# Patient Record
Sex: Female | Born: 1940 | Race: Black or African American | Hispanic: No | Smoking: Former smoker
Health system: Southern US, Community
[De-identification: ages and names within clinical notes are randomized; demographics above are authoritative.]

## PROBLEM LIST (undated history)

## (undated) DIAGNOSIS — N3289 Other specified disorders of bladder: Secondary | ICD-10-CM

## (undated) DIAGNOSIS — I10 Essential (primary) hypertension: Secondary | ICD-10-CM

## (undated) DIAGNOSIS — E669 Obesity, unspecified: Secondary | ICD-10-CM

## (undated) DIAGNOSIS — K760 Fatty (change of) liver, not elsewhere classified: Secondary | ICD-10-CM

## (undated) DIAGNOSIS — R109 Unspecified abdominal pain: Secondary | ICD-10-CM

## (undated) DIAGNOSIS — K5900 Constipation, unspecified: Secondary | ICD-10-CM

## (undated) DIAGNOSIS — D3A8 Other benign neuroendocrine tumors: Secondary | ICD-10-CM

## (undated) DIAGNOSIS — E785 Hyperlipidemia, unspecified: Secondary | ICD-10-CM

## (undated) DIAGNOSIS — K219 Gastro-esophageal reflux disease without esophagitis: Secondary | ICD-10-CM

## (undated) DIAGNOSIS — I4891 Unspecified atrial fibrillation: Secondary | ICD-10-CM

## (undated) DIAGNOSIS — K5790 Diverticulosis of intestine, part unspecified, without perforation or abscess without bleeding: Secondary | ICD-10-CM

## (undated) DIAGNOSIS — M1990 Unspecified osteoarthritis, unspecified site: Secondary | ICD-10-CM

## (undated) DIAGNOSIS — Z8489 Family history of other specified conditions: Secondary | ICD-10-CM

## (undated) HISTORY — PX: ROTATOR CUFF REPAIR: SHX139

## (undated) HISTORY — PX: EYE SURGERY: SHX253

## (undated) HISTORY — PX: CHOLECYSTECTOMY: SHX55

## (undated) HISTORY — PX: ABDOMINAL HYSTERECTOMY: SHX81

---

## 2005-02-07 ENCOUNTER — Ambulatory Visit: Payer: Self-pay | Admitting: Internal Medicine

## 2006-02-12 ENCOUNTER — Ambulatory Visit: Payer: Self-pay | Admitting: Internal Medicine

## 2006-03-14 ENCOUNTER — Emergency Department: Payer: Self-pay | Admitting: Unknown Physician Specialty

## 2007-06-21 ENCOUNTER — Ambulatory Visit: Payer: Self-pay | Admitting: Internal Medicine

## 2008-06-25 ENCOUNTER — Ambulatory Visit: Payer: Self-pay | Admitting: Internal Medicine

## 2008-07-29 ENCOUNTER — Ambulatory Visit: Payer: Self-pay | Admitting: Gastroenterology

## 2009-08-11 ENCOUNTER — Ambulatory Visit: Payer: Self-pay | Admitting: Internal Medicine

## 2009-11-18 ENCOUNTER — Ambulatory Visit: Payer: Self-pay | Admitting: Cardiology

## 2010-05-05 ENCOUNTER — Emergency Department: Payer: Self-pay | Admitting: Unknown Physician Specialty

## 2010-08-31 ENCOUNTER — Ambulatory Visit: Payer: Self-pay | Admitting: Internal Medicine

## 2011-02-26 ENCOUNTER — Emergency Department: Payer: Self-pay | Admitting: Emergency Medicine

## 2011-04-19 ENCOUNTER — Ambulatory Visit: Payer: Self-pay | Admitting: Internal Medicine

## 2011-05-08 ENCOUNTER — Emergency Department: Payer: Self-pay | Admitting: Emergency Medicine

## 2011-05-10 ENCOUNTER — Ambulatory Visit: Payer: Self-pay | Admitting: Family Medicine

## 2011-06-02 ENCOUNTER — Ambulatory Visit: Payer: Self-pay | Admitting: Surgery

## 2011-06-08 ENCOUNTER — Inpatient Hospital Stay: Payer: Self-pay | Admitting: Surgery

## 2011-06-12 LAB — PATHOLOGY REPORT

## 2011-07-25 DIAGNOSIS — R109 Unspecified abdominal pain: Secondary | ICD-10-CM

## 2011-07-25 HISTORY — DX: Unspecified abdominal pain: R10.9

## 2011-07-25 HISTORY — PX: HERNIA REPAIR: SHX51

## 2011-10-18 ENCOUNTER — Ambulatory Visit: Payer: Self-pay | Admitting: Internal Medicine

## 2012-03-04 ENCOUNTER — Ambulatory Visit: Payer: Self-pay

## 2012-08-23 ENCOUNTER — Ambulatory Visit: Payer: Self-pay

## 2012-09-15 DIAGNOSIS — K439 Ventral hernia without obstruction or gangrene: Secondary | ICD-10-CM | POA: Insufficient documentation

## 2012-09-26 DIAGNOSIS — K219 Gastro-esophageal reflux disease without esophagitis: Secondary | ICD-10-CM | POA: Insufficient documentation

## 2012-09-27 HISTORY — PX: HERNIA REPAIR: SHX51

## 2012-12-24 ENCOUNTER — Ambulatory Visit: Payer: Self-pay

## 2013-03-25 ENCOUNTER — Ambulatory Visit: Payer: Self-pay | Admitting: Physician Assistant

## 2014-01-14 ENCOUNTER — Ambulatory Visit: Payer: Self-pay

## 2014-07-02 DIAGNOSIS — S92501A Displaced unspecified fracture of right lesser toe(s), initial encounter for closed fracture: Secondary | ICD-10-CM | POA: Insufficient documentation

## 2014-12-16 ENCOUNTER — Other Ambulatory Visit: Payer: Self-pay | Admitting: Infectious Diseases

## 2014-12-16 DIAGNOSIS — K5909 Other constipation: Secondary | ICD-10-CM

## 2014-12-16 DIAGNOSIS — R1033 Periumbilical pain: Secondary | ICD-10-CM

## 2014-12-23 ENCOUNTER — Ambulatory Visit
Admission: RE | Admit: 2014-12-23 | Discharge: 2014-12-23 | Disposition: A | Discharge status: Home / Self Care | Payer: Medicare Other | Source: Ambulatory Visit | Attending: Infectious Diseases | Admitting: Infectious Diseases

## 2014-12-23 DIAGNOSIS — Z9049 Acquired absence of other specified parts of digestive tract: Secondary | ICD-10-CM | POA: Diagnosis not present

## 2014-12-23 DIAGNOSIS — K5909 Other constipation: Secondary | ICD-10-CM

## 2014-12-23 DIAGNOSIS — R1033 Periumbilical pain: Secondary | ICD-10-CM | POA: Diagnosis present

## 2014-12-23 DIAGNOSIS — K5900 Constipation, unspecified: Secondary | ICD-10-CM | POA: Insufficient documentation

## 2014-12-23 DIAGNOSIS — K760 Fatty (change of) liver, not elsewhere classified: Secondary | ICD-10-CM | POA: Diagnosis not present

## 2015-05-06 ENCOUNTER — Other Ambulatory Visit: Payer: Self-pay | Admitting: Infectious Diseases

## 2015-05-06 DIAGNOSIS — Z1231 Encounter for screening mammogram for malignant neoplasm of breast: Secondary | ICD-10-CM

## 2015-05-18 ENCOUNTER — Ambulatory Visit
Admission: RE | Admit: 2015-05-18 | Discharge: 2015-05-18 | Disposition: A | Discharge status: Home / Self Care | Payer: Medicare Other | Source: Ambulatory Visit | Attending: Infectious Diseases | Admitting: Infectious Diseases

## 2015-05-18 ENCOUNTER — Other Ambulatory Visit: Payer: Self-pay | Admitting: Infectious Diseases

## 2015-05-18 DIAGNOSIS — Z1231 Encounter for screening mammogram for malignant neoplasm of breast: Secondary | ICD-10-CM | POA: Insufficient documentation

## 2015-06-25 DIAGNOSIS — E669 Obesity, unspecified: Secondary | ICD-10-CM | POA: Insufficient documentation

## 2015-06-25 DIAGNOSIS — K760 Fatty (change of) liver, not elsewhere classified: Secondary | ICD-10-CM | POA: Insufficient documentation

## 2016-02-01 ENCOUNTER — Other Ambulatory Visit: Payer: Self-pay | Admitting: Physician Assistant

## 2016-02-01 DIAGNOSIS — Z1239 Encounter for other screening for malignant neoplasm of breast: Secondary | ICD-10-CM

## 2016-02-07 ENCOUNTER — Encounter: Payer: Self-pay | Admitting: *Deleted

## 2016-02-08 ENCOUNTER — Ambulatory Visit
Admission: RE | Admit: 2016-02-08 | Discharge: 2016-02-08 | Disposition: A | Discharge status: Home / Self Care | Payer: Medicare Other | Source: Ambulatory Visit | Attending: Gastroenterology | Admitting: Gastroenterology

## 2016-02-08 ENCOUNTER — Ambulatory Visit: Payer: Medicare Other | Admitting: Anesthesiology

## 2016-02-08 ENCOUNTER — Encounter
Admission: RE | Disposition: A | Discharge status: Home / Self Care | Payer: Self-pay | Source: Ambulatory Visit | Attending: Gastroenterology

## 2016-02-08 DIAGNOSIS — K6289 Other specified diseases of anus and rectum: Secondary | ICD-10-CM | POA: Insufficient documentation

## 2016-02-08 DIAGNOSIS — K219 Gastro-esophageal reflux disease without esophagitis: Secondary | ICD-10-CM | POA: Insufficient documentation

## 2016-02-08 DIAGNOSIS — Z87891 Personal history of nicotine dependence: Secondary | ICD-10-CM | POA: Insufficient documentation

## 2016-02-08 DIAGNOSIS — Z885 Allergy status to narcotic agent status: Secondary | ICD-10-CM | POA: Diagnosis not present

## 2016-02-08 DIAGNOSIS — Z1211 Encounter for screening for malignant neoplasm of colon: Secondary | ICD-10-CM | POA: Diagnosis not present

## 2016-02-08 DIAGNOSIS — Z6830 Body mass index (BMI) 30.0-30.9, adult: Secondary | ICD-10-CM | POA: Insufficient documentation

## 2016-02-08 DIAGNOSIS — I10 Essential (primary) hypertension: Secondary | ICD-10-CM | POA: Diagnosis not present

## 2016-02-08 DIAGNOSIS — Z79899 Other long term (current) drug therapy: Secondary | ICD-10-CM | POA: Diagnosis not present

## 2016-02-08 DIAGNOSIS — K5730 Diverticulosis of large intestine without perforation or abscess without bleeding: Secondary | ICD-10-CM | POA: Insufficient documentation

## 2016-02-08 DIAGNOSIS — M1990 Unspecified osteoarthritis, unspecified site: Secondary | ICD-10-CM | POA: Diagnosis not present

## 2016-02-08 DIAGNOSIS — D3A026 Benign carcinoid tumor of the rectum: Secondary | ICD-10-CM | POA: Diagnosis not present

## 2016-02-08 DIAGNOSIS — E785 Hyperlipidemia, unspecified: Secondary | ICD-10-CM | POA: Diagnosis not present

## 2016-02-08 DIAGNOSIS — E669 Obesity, unspecified: Secondary | ICD-10-CM | POA: Insufficient documentation

## 2016-02-08 DIAGNOSIS — D3A8 Other benign neuroendocrine tumors: Secondary | ICD-10-CM | POA: Insufficient documentation

## 2016-02-08 HISTORY — PX: COLONOSCOPY WITH PROPOFOL: SHX5780

## 2016-02-08 HISTORY — DX: Family history of other specified conditions: Z84.89

## 2016-02-08 HISTORY — DX: Gastro-esophageal reflux disease without esophagitis: K21.9

## 2016-02-08 HISTORY — DX: Essential (primary) hypertension: I10

## 2016-02-08 HISTORY — DX: Unspecified osteoarthritis, unspecified site: M19.90

## 2016-02-08 HISTORY — DX: Hyperlipidemia, unspecified: E78.5

## 2016-02-08 HISTORY — DX: Unspecified abdominal pain: R10.9

## 2016-02-08 HISTORY — DX: Constipation, unspecified: K59.00

## 2016-02-08 HISTORY — DX: Obesity, unspecified: E66.9

## 2016-02-08 HISTORY — DX: Other specified disorders of bladder: N32.89

## 2016-02-08 SURGERY — COLONOSCOPY WITH PROPOFOL
Anesthesia: General

## 2016-02-08 MED ORDER — PROPOFOL 10 MG/ML IV BOLUS
INTRAVENOUS | Status: DC | PRN
  Administered 2016-02-08: 100 mg via INTRAVENOUS

## 2016-02-08 MED ORDER — FENTANYL CITRATE (PF) 100 MCG/2ML IJ SOLN
INTRAMUSCULAR | Status: DC | PRN
  Administered 2016-02-08: 50 ug via INTRAVENOUS

## 2016-02-08 MED ORDER — PROPOFOL 500 MG/50ML IV EMUL
INTRAVENOUS | Status: DC | PRN
  Administered 2016-02-08: 150 ug/kg/min via INTRAVENOUS

## 2016-02-08 MED ORDER — LIDOCAINE 2% (20 MG/ML) 5 ML SYRINGE
INTRAMUSCULAR | Status: DC | PRN
  Administered 2016-02-08: 40 mg via INTRAVENOUS

## 2016-02-08 MED ORDER — SODIUM CHLORIDE 0.9 % IV SOLN
INTRAVENOUS | Status: DC
Start: 2016-02-08 — End: 2016-02-08
  Administered 2016-02-08: 1000 mL via INTRAVENOUS
  Administered 2016-02-08: 08:00:00 via INTRAVENOUS

## 2016-02-08 MED ORDER — MIDAZOLAM HCL 5 MG/5ML IJ SOLN
INTRAMUSCULAR | Status: DC | PRN
  Administered 2016-02-08: 1 mg via INTRAVENOUS

## 2016-02-08 MED ORDER — PHENYLEPHRINE HCL 10 MG/ML IJ SOLN
INTRAMUSCULAR | Status: DC | PRN
  Administered 2016-02-08 (×2): 100 ug via INTRAVENOUS

## 2016-02-08 MED ORDER — SODIUM CHLORIDE 0.9 % IV SOLN: INTRAVENOUS | Status: DC

## 2016-02-08 NOTE — Anesthesia Preprocedure Evaluation (Signed)
Anesthesia Evaluation  Patient identified by MRN, date of birth, ID band Patient awake    Reviewed: Allergy & Precautions, H&P , NPO status , Patient's Chart, lab work & pertinent test results, reviewed documented beta blocker date and time   Airway Mallampati: II   Neck ROM: full    Dental  (+) Teeth Intact   Pulmonary neg pulmonary ROS, former smoker,    Pulmonary exam normal        Cardiovascular Exercise Tolerance: Good hypertension, On Medications negative cardio ROS Normal cardiovascular exam Rhythm:regular Rate:Normal     Neuro/Psych negative neurological ROS  negative psych ROS   GI/Hepatic negative GI ROS, Neg liver ROS, GERD  Medicated,  Endo/Other  negative endocrine ROS  Renal/GU negative Renal ROS  negative genitourinary   Musculoskeletal   Abdominal   Peds  Hematology negative hematology ROS (+)   Anesthesia Other Findings Past Medical History:   Abdominal pain                                  2013         Bladder wall thickening                                      Constipation                                                 Hyperlipidemia                                               Hypertension                                                 Arthritis                                                    Obesity (BMI 30-39.9)                                        GERD (gastroesophageal reflux disease)                       Family history of adverse reaction to anesthes*            Past Surgical History:   EYE SURGERY                                                   ABDOMINAL HYSTERECTOMY  CHOLECYSTECTOMY                                               ROTATOR CUFF REPAIR                             Left              HERNIA REPAIR                                    2013           Comment:umbilical   HERNIA REPAIR                                    09/27/2012        Comment:ventral hernia BMI    Body Mass Index   32.54 kg/m 2     Reproductive/Obstetrics negative OB ROS                             Anesthesia Physical Anesthesia Plan  ASA: III  Anesthesia Plan: General   Post-op Pain Management:    Induction:   Airway Management Planned:   Additional Equipment:   Intra-op Plan:   Post-operative Plan:   Informed Consent: I have reviewed the patients History and Physical, chart, labs and discussed the procedure including the risks, benefits and alternatives for the proposed anesthesia with the patient or authorized representative who has indicated his/her understanding and acceptance.   Dental Advisory Given  Plan Discussed with: CRNA  Anesthesia Plan Comments:         Anesthesia Quick Evaluation

## 2016-02-08 NOTE — Op Note (Addendum)
[facility] Patient Name: Jodi Stein Procedure Date: 02/08/2016 8:30 AM MRN: [id number] Account #: [id number] Date of Birth: [birth year] Admit Type: Outpatient Age: 75 Room: [facility] ENDO ROOM 3 Gender: Female Note Status: Supervisor Override Procedure:            Colonoscopy Indications:          Screening for colorectal malignant neoplasm Providers:            Lollie Sails, MD Referring MD:         Adrian Prows (Referring MD) Medicines:            Monitored Anesthesia Care Complications:        No immediate complications. Procedure:            Pre-Anesthesia Assessment:                       - ASA Grade Assessment: III - A patient with severe                        systemic disease.                       After obtaining informed consent, the colonoscope was                        passed under direct vision. Throughout the procedure,                        the patient's blood pressure, pulse, and oxygen                        saturations were monitored continuously. The                        Colonoscope was introduced through the anus and                        advanced to the the cecum, identified by appendiceal                        orifice and ileocecal valve. The colonoscopy was                        unusually difficult due to significant looping and a                        tortuous colon. Successful completion of the procedure                        was aided by changing the patient to a prone position                        and using manual pressure. The patient tolerated the                        procedure well. The quality of the bowel preparation                        was good. Findings:      Multiple medium-mouthed diverticula were found in the sigmoid colon,  descending colon, transverse colon and ascending colon.      One 2 mm submucosal nodule was found in the distal rectum. Biopsies were       taken with a cold  forceps for histology.      The digital rectal exam was normal.      The descending colon and transverse colon were significantly redundant. Impression:           - Diverticulosis in the sigmoid colon, in the                        descending colon, in the transverse colon and in the                        ascending colon.                       - Submucosal nodule in the distal rectum. Biopsied. Recommendation:       - Discharge patient to home. Procedure Code(s):    --- Professional ---                       [phone], Colonoscopy, flexible; with biopsy, single or                        multiple Diagnosis Code(s):    --- Professional ---                       Z12.11, Encounter for screening for malignant neoplasm                        of colon                       K62.89, Other specified diseases of anus and rectum                       K57.30, Diverticulosis of large intestine without                        perforation or abscess without bleeding CPT copyright 2016 American Medical Association. All rights reserved. The codes documented in this report are preliminary and upon coder review may  be revised to meet current compliance requirements. Lollie Sails, MD 02/08/2016 9:14:19 AM This report has been signed electronically. Number of Addenda: 0 Note Initiated On: 02/08/2016 8:30 AM Scope Withdrawal Time: 0 hours 9 minutes 46 seconds  Total Procedure Duration: 0 hours 31 minutes 51 seconds       [facility]

## 2016-02-08 NOTE — H&P (Signed)
Outpatient short stay form Pre-procedure 02/08/2016 8:24 AM Lollie Sails MD  Primary Physician: Dr. Adrian Prows  Reason for visit:  Colonoscopy  History of present illness:  Patient is a 75 year old female presenting today for screening colonoscopy. Her last colonoscopy was about 10 years ago. She does not remember whether there were any polyps at that time. There is no family history of colon cancer. Tolerated her prep well. She takes no aspirin or blood thinning agents.    Current facility-administered medications:  .  0.9 %  sodium chloride infusion, , Intravenous, Continuous, Lollie Sails, MD, Last Rate: 20 mL/hr at 02/08/16 0721 .  0.9 %  sodium chloride infusion, , Intravenous, Continuous, Lollie Sails, MD  Prescriptions prior to admission  Medication Sig Dispense Refill Last Dose  . fluticasone (FLONASE) 50 MCG/ACT nasal spray Place 1 spray into both nostrils 2 (two) times daily.     Marland Kitchen ibuprofen (ADVIL,MOTRIN) 100 MG tablet Take 800 mg by mouth every 6 (six) hours as needed for fever.     . lactulose (CHRONULAC) 10 GM/15ML solution Take 10 g by mouth once a week.     Marland Kitchen lisinopril (PRINIVIL,ZESTRIL) 20 MG tablet Take 20 mg by mouth daily.   02/08/2016 at 0600  . naproxen sodium (ANAPROX) 220 MG tablet Take 220 mg by mouth 2 (two) times daily with a meal.     . omeprazole (PRILOSEC) 20 MG capsule Take 20 mg by mouth daily.     . simvastatin (ZOCOR) 40 MG tablet Take 40 mg by mouth daily.        Allergies  Allergen Reactions  . Codeine Nausea Only  . Tramadol Nausea And Vomiting     Past Medical History  Diagnosis Date  . Abdominal pain 2013  . Bladder wall thickening   . Constipation   . Hyperlipidemia   . Hypertension   . Arthritis   . Obesity (BMI 30-39.9)   . GERD (gastroesophageal reflux disease)   . Family history of adverse reaction to anesthesia     Review of systems:      Physical Exam    Heart and lungs: Regular rate and rhythm  without rub or gallop, lungs are bilaterally clear.    HEENT: Normocephalic atraumatic eyes are anicteric    Other:     Pertinant exam for procedure: Soft nontender nondistended bowel sounds positive normoactive, protuberant.    Planned proceedures: Colonoscopy and indicated procedures. I have discussed the risks benefits and complications of procedures to include not limited to bleeding, infection, perforation and the risk of sedation and the patient wishes to proceed.    Lollie Sails, MD Gastroenterology 02/08/2016  8:24 AM

## 2016-02-08 NOTE — Transfer of Care (Signed)
Immediate Anesthesia Transfer of Care Note  Patient: Jodi Stein  Procedure(s) Performed: Procedure(s): COLONOSCOPY WITH PROPOFOL (N/A)  Patient Location: PACU and Endoscopy Unit  Anesthesia Type:General  Level of Consciousness: oriented  Airway & Oxygen Therapy: Patient Spontanous Breathing and Patient connected to nasal cannula oxygen  Post-op Assessment: Report given to RN and Post -op Vital signs reviewed and stable  Post vital signs: stable  Last Vitals:  Filed Vitals:   02/08/16 0707 02/08/16 0917  BP: 120/53 104/73  Pulse: 83 70  Temp: 36.4 C 36.6 C  Resp: 18 17    Last Pain: There were no vitals filed for this visit.       Complications: No apparent anesthesia complications

## 2016-02-09 ENCOUNTER — Encounter: Payer: Self-pay | Admitting: Gastroenterology

## 2016-02-09 NOTE — Anesthesia Postprocedure Evaluation (Signed)
Anesthesia Post Note  Patient: Jodi Stein  Procedure(s) Performed: Procedure(s) (LRB): COLONOSCOPY WITH PROPOFOL (N/A)  Patient location during evaluation: PACU Anesthesia Type: General Level of consciousness: awake and alert Pain management: pain level controlled Vital Signs Assessment: post-procedure vital signs reviewed and stable Respiratory status: spontaneous breathing, nonlabored ventilation, respiratory function stable and patient connected to nasal cannula oxygen Cardiovascular status: blood pressure returned to baseline and stable Postop Assessment: no signs of nausea or vomiting Anesthetic complications: no    Last Vitals:  Filed Vitals:   02/08/16 0917 02/08/16 0924  BP: 104/73 104/73  Pulse: 70 71  Temp: 36.6 C 36.1 C  Resp: 17 15    Last Pain:  Filed Vitals:   02/09/16 0738  PainSc: 0-No pain                 Molli Barrows

## 2016-02-15 LAB — SURGICAL PATHOLOGY

## 2016-03-01 ENCOUNTER — Other Ambulatory Visit: Payer: Self-pay | Admitting: Infectious Diseases

## 2016-03-01 DIAGNOSIS — Z1231 Encounter for screening mammogram for malignant neoplasm of breast: Secondary | ICD-10-CM

## 2016-03-03 ENCOUNTER — Other Ambulatory Visit: Payer: Self-pay | Admitting: Gastroenterology

## 2016-03-03 DIAGNOSIS — D3A026 Benign carcinoid tumor of the rectum: Secondary | ICD-10-CM

## 2016-03-10 ENCOUNTER — Ambulatory Visit
Admission: RE | Admit: 2016-03-10 | Discharge: 2016-03-10 | Disposition: A | Discharge status: Home / Self Care | Payer: Medicare Other | Source: Ambulatory Visit | Attending: Gastroenterology | Admitting: Gastroenterology

## 2016-03-10 DIAGNOSIS — N200 Calculus of kidney: Secondary | ICD-10-CM | POA: Insufficient documentation

## 2016-03-10 DIAGNOSIS — D3A026 Benign carcinoid tumor of the rectum: Secondary | ICD-10-CM | POA: Insufficient documentation

## 2016-03-10 DIAGNOSIS — R911 Solitary pulmonary nodule: Secondary | ICD-10-CM | POA: Insufficient documentation

## 2016-03-10 MED ORDER — IOPAMIDOL (ISOVUE-300) INJECTION 61%
100.0000 mL | Freq: Once | INTRAVENOUS | Status: AC | PRN
Start: 2016-03-10 — End: 2016-03-10
  Administered 2016-03-10: 100 mL via INTRAVENOUS

## 2016-05-08 ENCOUNTER — Other Ambulatory Visit: Payer: Self-pay | Admitting: Infectious Diseases

## 2016-05-08 DIAGNOSIS — Z1231 Encounter for screening mammogram for malignant neoplasm of breast: Secondary | ICD-10-CM

## 2016-06-08 ENCOUNTER — Ambulatory Visit
Admission: RE | Admit: 2016-06-08 | Discharge: 2016-06-08 | Disposition: A | Discharge status: Home / Self Care | Payer: Medicare Other | Source: Ambulatory Visit | Attending: Infectious Diseases | Admitting: Infectious Diseases

## 2016-06-08 DIAGNOSIS — Z1231 Encounter for screening mammogram for malignant neoplasm of breast: Secondary | ICD-10-CM | POA: Diagnosis present

## 2016-08-07 ENCOUNTER — Encounter: Payer: Self-pay | Admitting: *Deleted

## 2016-08-08 ENCOUNTER — Ambulatory Visit
Admission: RE | Admit: 2016-08-08 | Discharge: 2016-08-08 | Disposition: A | Discharge status: Home / Self Care | Payer: Medicare Other | Source: Ambulatory Visit | Attending: Gastroenterology | Admitting: Gastroenterology

## 2016-08-08 ENCOUNTER — Encounter: Payer: Self-pay | Admitting: *Deleted

## 2016-08-08 ENCOUNTER — Encounter
Admission: RE | Disposition: A | Discharge status: Home / Self Care | Payer: Self-pay | Source: Ambulatory Visit | Attending: Gastroenterology

## 2016-08-08 DIAGNOSIS — Z79899 Other long term (current) drug therapy: Secondary | ICD-10-CM | POA: Diagnosis not present

## 2016-08-08 DIAGNOSIS — Z7951 Long term (current) use of inhaled steroids: Secondary | ICD-10-CM | POA: Insufficient documentation

## 2016-08-08 DIAGNOSIS — D3A026 Benign carcinoid tumor of the rectum: Secondary | ICD-10-CM | POA: Diagnosis present

## 2016-08-08 DIAGNOSIS — Z791 Long term (current) use of non-steroidal anti-inflammatories (NSAID): Secondary | ICD-10-CM | POA: Diagnosis not present

## 2016-08-08 HISTORY — PX: FLEXIBLE SIGMOIDOSCOPY: SHX5431

## 2016-08-08 HISTORY — DX: Fatty (change of) liver, not elsewhere classified: K76.0

## 2016-08-08 SURGERY — SIGMOIDOSCOPY, FLEXIBLE
Anesthesia: General

## 2016-08-08 MED ORDER — MIDAZOLAM HCL 5 MG/5ML IJ SOLN
INTRAMUSCULAR | Status: DC | PRN
  Administered 2016-08-08: 1 mg via INTRAVENOUS
  Administered 2016-08-08: 2 mg via INTRAVENOUS

## 2016-08-08 MED ORDER — SODIUM CHLORIDE 0.9 % IV SOLN: INTRAVENOUS | Status: DC

## 2016-08-08 MED ORDER — MIDAZOLAM HCL 5 MG/5ML IJ SOLN
INTRAMUSCULAR | Status: DC
Start: 2016-08-08 — End: 2016-08-08
  Filled 2016-08-08: qty 10

## 2016-08-08 MED ORDER — SODIUM CHLORIDE 0.9 % IV SOLN
INTRAVENOUS | Status: DC
  Administered 2016-08-08: 1000 mL via INTRAVENOUS

## 2016-08-08 MED ORDER — SPOT INK MARKER SYRINGE KIT
PACK | SUBMUCOSAL | Status: DC | PRN
  Administered 2016-08-08 (×2): 1 mL via SUBMUCOSAL

## 2016-08-08 MED ORDER — FENTANYL CITRATE (PF) 100 MCG/2ML IJ SOLN
INTRAMUSCULAR | Status: DC
Start: 2016-08-08 — End: 2016-08-08
  Filled 2016-08-08: qty 2

## 2016-08-08 NOTE — Op Note (Addendum)
[facility] Patient Name: Jodi Stein Procedure Date: 08/08/2016 3:20 PM MRN: [id number] Account #: [id number] Date of Birth: 1941/02/12 Admit Type: Outpatient Age: 76 Room: [facility] ENDO ROOM 3 Gender: Female Note Status: Finalized Procedure:            Flexible Sigmoidoscopy Indications:          rectal carcinoid/NET Providers:            Lollie Sails, MD Referring MD:         Adrian Prows (Referring MD) Medicines:            Midazolam 3 mg IV Complications:        No immediate complications. Procedure:            Pre-Anesthesia Assessment:                       - ASA Grade Assessment: II - A patient with mild                        systemic disease.                       After obtaining informed consent, the scope was passed                        under direct vision. The Colonoscope was introduced                        through the anus and advanced to the the sigmoid colon.                        The flexible sigmoidoscopy was accomplished without                        difficulty. The patient tolerated the procedure well. Findings:      The digital rectal exam was normal. The scope was advanced to the       sigmoid colon, all of this appearing normal, then returned to the       rectum. I was unable to obtain adequate retroflex , however, a site of       the previous lesion was located. A mucosal biopsy was done which showed       a yellow colored lesion below the mucosa. A second biopsy did not remove       this lesion.      An area in the rectum was tattooed with an injection of 0.5 mL of [place]       ink, applied at both lateral sides of the lesion.      The exam was otherwise normal throughout the examined colon. Impression:           - An area in the rectum was tattooed. Recommendation:       - Await pathology results. If positive for the lesion,                        will arrange for EUS for further evaluation. Procedure  Code(s):    --- Professional ---                       [phone], Sigmoidoscopy, flexible; with directed  submucosal injection(s), any substance CPT copyright 2016 American Medical Association. All rights reserved. The codes documented in this report are preliminary and upon coder review may  be revised to meet current compliance requirements. Lollie Sails, MD 08/08/2016 4:05:37 PM This report has been signed electronically. Number of Addenda: 0 Note Initiated On: 08/08/2016 3:20 PM      [facility]

## 2016-08-08 NOTE — Brief Op Note (Signed)
Site of previous rectal carcinoid biopsied and marked with SPOT.

## 2016-08-08 NOTE — H&P (Signed)
Outpatient short stay form Pre-procedure 08/08/2016 3:21 PM Lollie Sails MD  Primary Physician: Dr. Adrian Prows  Reason for visit:  Sedated flexible sigmoidoscopy  History of present illness:  Patient is a 76 year old female presenting today as above. She has a history of a colonoscopy being done on 02/08/2016. 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 He is presenting today for follow-up on this to make sure that it was completely removed. She is somewhat nervous today and we will likely use a bit of Versed to help her relax. I discussed anesthesia and multiple aspects of this procedure with her and her family members. She takes no aspirin products. She takes no prescribed blood thinner.   Current Facility-Administered Medications:  .  0.9 %  sodium chloride infusion, , Intravenous, Continuous, Lollie Sails, MD, Last Rate: 20 mL/hr at 08/08/16 1430, 1,000 mL at 08/08/16 1430 .  0.9 %  sodium chloride infusion, , Intravenous, Continuous, Lollie Sails, MD .  fentaNYL (SUBLIMAZE) 100 MCG/2ML injection, , , ,  .  midazolam (VERSED) 5 MG/5ML injection, , , ,   Prescriptions Prior to Admission  Medication Sig Dispense Refill Last Dose  . fluticasone (FLONASE) 50 MCG/ACT nasal spray Place 1 spray into both nostrils 2 (two) times daily.   Past Month at Unknown time  . ibuprofen (ADVIL,MOTRIN) 100 MG tablet Take 800 mg by mouth every 6 (six) hours as needed for fever.   Past Month at Unknown time  . lactulose (CHRONULAC) 10 GM/15ML solution Take 10 g by mouth once a week.   Past Week at Unknown time  . lisinopril (PRINIVIL,ZESTRIL) 20 MG tablet Take 20 mg by mouth daily.   08/08/2016 at Unknown time  . omeprazole (PRILOSEC) 20 MG capsule Take 20 mg by mouth daily.   08/07/2016 at Unknown time  . simvastatin (ZOCOR) 40 MG tablet Take 40 mg by mouth daily.   08/07/2016 at Unknown time  .  naproxen sodium (ANAPROX) 220 MG tablet Take 220 mg by mouth 2 (two) times daily with a meal.   Not Taking at Unknown time     Allergies  Allergen Reactions  . Codeine Nausea Only  . Tramadol Nausea And Vomiting     Past Medical History:  Diagnosis Date  . Abdominal pain 2013  . Arthritis   . Bladder wall thickening   . Constipation   . Family history of adverse reaction to anesthesia   . GERD (gastroesophageal reflux disease)   . Hyperlipidemia   . Hypertension   . Obesity (BMI 30-39.9)   . Steatosis of liver     Review of systems:      Physical Exam    Heart and lungs: Regular rate and rhythm without rub or gallop, lungs are bilaterally clear.    HEENT: Normocephalic atraumatic eyes are anicteric    Other:     Pertinant exam for procedure: Soft nontender nondistended bowel sounds positive normoactive.    Planned proceedures: Flexible sigmoidoscopy with mild sedation. I have discussed the risks benefits and complications of procedures to include not limited to bleeding, infection, perforation and the risk of sedation and the patient wishes to proceed.    Lollie Sails, MD Gastroenterology 08/08/2016  3:21 PM

## 2016-08-10 LAB — SURGICAL PATHOLOGY

## 2016-08-11 ENCOUNTER — Encounter: Payer: Self-pay | Admitting: Gastroenterology

## 2016-08-22 ENCOUNTER — Telehealth: Payer: Self-pay

## 2016-08-22 NOTE — Telephone Encounter (Signed)
  Oncology Nurse Navigator Documentation Received referral from [place] at [facility] for rectal EUS to evaluate rectal carcinoid. Contacted patient for her location preference as [place] does not have availability until 2/22. She can have EUS done at [place] likely next week or in [place] within the next two weeks. She asked if I could reach out to [facility] to find out how soon they could do it as she prefers [place] because she has family there that can assist her. She will ultimately make her decision after talking with her daughter in [place]. I have contacted Christian Mate with Dr. Ardis Hughs to obtain a timeframe for her to assist with making her decision. Awaiting that response. Navigator Location: [facility] (08/22/16 1600)   )Navigator Encounter Type: Telephone (08/22/16 1600) Telephone: Outgoing Call (08/22/16 1600)                       Barriers/Navigation Needs: Coordination of Care (08/22/16 1600)   Interventions: Coordination of Care (08/22/16 1600)   Coordination of Care: EUS (08/22/16 1600)        Acuity: Level 2 (08/22/16 1600)   Acuity Level 2: Initial guidance, education and coordination as needed;Educational needs;Assistance expediting appointments;Ongoing guidance and education throughout treatment as needed (08/22/16 1600)

## 2016-08-23 ENCOUNTER — Telehealth: Payer: Self-pay

## 2016-08-23 ENCOUNTER — Other Ambulatory Visit: Payer: Self-pay

## 2016-08-23 DIAGNOSIS — D3A026 Benign carcinoid tumor of the rectum: Secondary | ICD-10-CM

## 2016-08-23 NOTE — Telephone Encounter (Signed)
Dr Ardis Hughs do you want me to go ahead and book this case for tomorrow afternoon?

## 2016-08-23 NOTE — Telephone Encounter (Signed)
Clent Jacks, RN sent to Barron Alvine, RN        She wants to do it tomorrow. Her name is Jodi Stein 09-01-2040. Her cell is [phone] for contact. Stephens November NP at [facility] is the referring. What do I need to send you? Thanks for your help!  Kristi   Previous Messages    ----- Message -----  From: Barron Alvine, RN  Sent: 08/23/2016  8:28 AM  To: Clent Jacks, RN  Subject: FW: EUS                        ----- Message -----  From: Milus Banister, MD  Sent: 08/23/2016  7:23 AM  To: Barron Alvine, RN  Subject: RE: EUS                      Since rectal EUS only requires moderate sedation I could add this on tomorrow. If that does not work since it's pretty last minute notice then I could add it on Thursday the 15th.   Thanks    ----- Message -----  From: Barron Alvine, RN  Sent: 08/22/2016  4:49 PM  To: Milus Banister, MD  Subject: FW: EUS                        ----- Message -----  From: Clent Jacks, RN  Sent: 08/22/2016  4:30 PM  To: Barron Alvine, RN  Subject: EUS                        Hi Wylan Gentzler, I have a patient that needs an EUS for rectal carcinoid. She is trying to decide if she wants to have it done in [place] or at [place]. How far out is Dr. Ardis Hughs booking an EUS case. I told her it usually takes me around 2 weeks to get one with him.

## 2016-08-23 NOTE — Telephone Encounter (Signed)
Ok to schedule for tomorrow.  Thanks

## 2016-08-23 NOTE — Telephone Encounter (Signed)
EUS scheduled, pt instructed and medications reviewed.  Patient instructions mailed to home.  Patient to call with any questions or concerns. Instructions also faxed to the pt home per her request at [phone]

## 2016-08-24 ENCOUNTER — Encounter (HOSPITAL_COMMUNITY)
Admission: RE | Disposition: A | Discharge status: Home / Self Care | Payer: Self-pay | Source: Ambulatory Visit | Attending: Gastroenterology

## 2016-08-24 ENCOUNTER — Encounter (HOSPITAL_COMMUNITY): Payer: Self-pay | Admitting: *Deleted

## 2016-08-24 ENCOUNTER — Ambulatory Visit (HOSPITAL_COMMUNITY)
Admission: RE | Admit: 2016-08-24 | Discharge: 2016-08-24 | Disposition: A | Discharge status: Home / Self Care | Payer: Medicare Other | Source: Ambulatory Visit | Attending: Gastroenterology | Admitting: Gastroenterology

## 2016-08-24 DIAGNOSIS — K219 Gastro-esophageal reflux disease without esophagitis: Secondary | ICD-10-CM | POA: Diagnosis not present

## 2016-08-24 DIAGNOSIS — Z6830 Body mass index (BMI) 30.0-30.9, adult: Secondary | ICD-10-CM | POA: Insufficient documentation

## 2016-08-24 DIAGNOSIS — E669 Obesity, unspecified: Secondary | ICD-10-CM | POA: Insufficient documentation

## 2016-08-24 DIAGNOSIS — I10 Essential (primary) hypertension: Secondary | ICD-10-CM | POA: Diagnosis not present

## 2016-08-24 DIAGNOSIS — K5900 Constipation, unspecified: Secondary | ICD-10-CM | POA: Insufficient documentation

## 2016-08-24 DIAGNOSIS — R933 Abnormal findings on diagnostic imaging of other parts of digestive tract: Secondary | ICD-10-CM | POA: Insufficient documentation

## 2016-08-24 DIAGNOSIS — Z87891 Personal history of nicotine dependence: Secondary | ICD-10-CM | POA: Diagnosis not present

## 2016-08-24 DIAGNOSIS — Z8603 Personal history of neoplasm of uncertain behavior: Secondary | ICD-10-CM | POA: Diagnosis not present

## 2016-08-24 DIAGNOSIS — Z79899 Other long term (current) drug therapy: Secondary | ICD-10-CM | POA: Insufficient documentation

## 2016-08-24 DIAGNOSIS — D3A026 Benign carcinoid tumor of the rectum: Secondary | ICD-10-CM | POA: Diagnosis not present

## 2016-08-24 DIAGNOSIS — E785 Hyperlipidemia, unspecified: Secondary | ICD-10-CM | POA: Diagnosis not present

## 2016-08-24 HISTORY — PX: EUS: SHX5427

## 2016-08-24 SURGERY — ULTRASOUND, LOWER GI TRACT, ENDOSCOPIC
Anesthesia: Moderate Sedation

## 2016-08-24 MED ORDER — MIDAZOLAM HCL 10 MG/2ML IJ SOLN
INTRAMUSCULAR | Status: DC | PRN
  Administered 2016-08-24: 2 mg via INTRAVENOUS
  Administered 2016-08-24: 1 mg via INTRAVENOUS

## 2016-08-24 MED ORDER — FENTANYL CITRATE (PF) 100 MCG/2ML IJ SOLN
INTRAMUSCULAR | Status: DC | PRN
  Administered 2016-08-24: 25 ug via INTRAVENOUS

## 2016-08-24 MED ORDER — FENTANYL CITRATE (PF) 100 MCG/2ML IJ SOLN
INTRAMUSCULAR | Status: AC
  Filled 2016-08-24: qty 2

## 2016-08-24 MED ORDER — DIPHENHYDRAMINE HCL 50 MG/ML IJ SOLN
INTRAMUSCULAR | Status: AC
  Filled 2016-08-24: qty 1

## 2016-08-24 MED ORDER — SODIUM CHLORIDE 0.9 % IV SOLN: INTRAVENOUS | Status: DC

## 2016-08-24 MED ORDER — MIDAZOLAM HCL 5 MG/ML IJ SOLN
INTRAMUSCULAR | Status: AC
  Filled 2016-08-24: qty 2

## 2016-08-24 NOTE — Discharge Instructions (Signed)

## 2016-08-24 NOTE — H&P (Signed)
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 Gustavo Lah at [facility] for screening: "a 56mm submucosal nodule in distal rectum" was biopsied.  Path showed carcinoid.  07/2015 Flex sig Dr. Gustavo Lah; site of previous biopsies identified, normal overlying mucosa but biopsies showed yellow tissue deep, pathology + again for carcinoid.  ROS: complete GI ROS as described in HPI.  Constitutional:  No unintentional weight loss   Past Medical History:  Diagnosis Date  . Abdominal pain 2013  . Arthritis   . Bladder wall thickening   . Constipation   . Family history of adverse reaction to anesthesia   . GERD (gastroesophageal reflux disease)   . Hyperlipidemia   . Hypertension   . Obesity (BMI 30-39.9)   . Steatosis of liver     Past Surgical History:  Procedure Laterality Date  . ABDOMINAL HYSTERECTOMY    . CHOLECYSTECTOMY    . COLONOSCOPY WITH PROPOFOL N/A 02/08/2016   Procedure: COLONOSCOPY WITH PROPOFOL;  Surgeon: Lollie Sails, MD;  Location: [facility] ENDOSCOPY;  Service: Endoscopy;  Laterality: N/A;  . EYE SURGERY    . FLEXIBLE SIGMOIDOSCOPY N/A 08/08/2016   Procedure: FLEXIBLE SIGMOIDOSCOPY;  Surgeon: Lollie Sails, MD;  Location: [facility] ENDOSCOPY;  Service: Endoscopy;  Laterality: N/A;  . HERNIA REPAIR  0000000   umbilical  . HERNIA REPAIR  09/27/2012   ventral hernia  . ROTATOR CUFF REPAIR Left     No current facility-administered medications for this encounter.     Allergies as of 08/23/2016 - Review Complete 08/23/2016  Allergen Reaction Noted  . Codeine Nausea And Vomiting 02/07/2016  . Tramadol Nausea And Vomiting 02/07/2016    Family History  Problem Relation Age of Onset  . Breast cancer Neg Hx     Social History   Social History  . Marital status: Widowed    Spouse name: N/A  . Number of children: N/A  . Years of education: N/A   Occupational History  . Not on file.   Social History Main Topics  .  Smoking status: Former Smoker    Packs/day: 0.25    Years: 2.00    Types: Cigarettes    Quit date: 07/24/1961  . Smokeless tobacco: Never Used  . Alcohol use No  . Drug use: No  . Sexual activity: Not on file   Other Topics Concern  . Not on file   Social History Narrative  . No narrative on file     Physical Exam: There were no vitals taken for this visit. 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  Will consider EMR if appropriate as well.  Please see the "Patient Instructions" section for addition details about the plan.  Owens Loffler, MD [facility] 08/24/2016, 12:54 PM

## 2016-08-24 NOTE — Progress Notes (Signed)
  Oncology Nurse Navigator Documentation EUS report routed to [facility] NP Navigator Location: [facility] (08/24/16 1700)   )Navigator Encounter Type: Diagnostic Results (08/24/16 1700)                                 Coordination of Care: EUS (08/24/16 1700)                  Time Spent with Patient: 15 (08/24/16 1700)

## 2016-08-24 NOTE — Op Note (Signed)
[facility] Patient Name: Jodi Stein Procedure Date: 08/24/2016 MRN: [id number] Attending MD: Milus Banister , MD Date of Birth: [birth year] CSN: [id number] Age: 76 Admit Type: Outpatient Procedure:                Lower EUS Indications:              Abnormal flex sig/colonoscopy: 01/2016 colonoscopy                            Dr. Gustavo Lah at [facility] for screening: "a                            67mm submucosal nodule in distal rectum" was                            biopsied. Path showed carcinoid. 02/2016 CT scan                            abdomen, pelvis was essentially normal. 07/2015 Flex                            sig Dr. Gustavo Lah; site of previous biopsies                            identified, normal overlying mucosa but biopsies                            showed yellow tissue deep, pathology + again for                            carcinoid. Providers:                Milus Banister, MD, Carmie End, RN, Cherylynn Ridges, Technician Referring MD:             Loistine Simas, MD Medicines:                Fentanyl 25 micrograms IV, Midazolam 3 mg IV Complications:            No immediate complications. Estimated blood loss:                            None. Estimated Blood Loss:     Estimated blood loss: none. Procedure:                Pre-Anesthesia Assessment:                           - Prior to the procedure, a History and Physical                            was performed, and patient medications and                            allergies  were reviewed. The patient's tolerance of                            previous anesthesia was also reviewed. The risks                            and benefits of the procedure and the sedation                            options and risks were discussed with the patient.                            All questions were answered, and informed consent                            was obtained. Prior  Anticoagulants: The patient has                            taken no previous anticoagulant or antiplatelet                            agents. ASA Grade Assessment: II - A patient with                            mild systemic disease. After reviewing the risks                            and benefits, the patient was deemed in                            satisfactory condition to undergo the procedure.                           After obtaining informed consent, the endoscope was                            passed under direct vision. Throughout the                            procedure, the patient's blood pressure, pulse, and                            oxygen saturations were monitored continuously. The                            [id number]) scope was introduced through                            the anus and advanced to the the sigmoid colon. The                            lower EUS was accomplished without difficulty. The  patient tolerated the procedure well. The quality                            of the bowel preparation was adequate. Scope In: Scope Out: Findings:      Sigmoidoscopic findings:      1. Small pinpoint of blue ink tattoo (previous [place] Ink site) in very       distal rectum. The surrounding mucosa was normal appearing.      Echoendoscope findings:      1. The rectal wall was normal by EUS criteria. I extensively examined       the mid and distal rectal wall, especially the region near the remaining       pinpoint of [place] Ink and the rectal wall appeared normal. Impression:               - No endoscopic or echoendoscopic evidence of                            rectal tumors, nodules in mid/distal rectum or                            specifically near the site of recent [place] Ink                            injection. Moderate Sedation:      N/A- Per Anesthesia Care Recommendation:           - Given previous carcinoid findings, I recommend                             repeat flexible sigmoidoscopy with Dr. Gustavo Lah in                            1 year. Procedure Code(s):        --- Professional ---                           [phone], Sigmoidoscopy, flexible; with endoscopic                            ultrasound examination Diagnosis Code(s):        --- Professional ---                           K63.89, Other specified diseases of intestine CPT copyright 2016 American Medical Association. All rights reserved. The codes documented in this report are preliminary and upon coder review may  be revised to meet current compliance requirements. Milus Banister, MD 08/24/2016 3:44:15 PM This report has been signed electronically. Number of Addenda: 0

## 2016-08-27 ENCOUNTER — Encounter (HOSPITAL_COMMUNITY): Payer: Self-pay | Admitting: Gastroenterology

## 2016-09-13 ENCOUNTER — Other Ambulatory Visit: Payer: Self-pay | Admitting: Gastroenterology

## 2016-09-13 DIAGNOSIS — M533 Sacrococcygeal disorders, not elsewhere classified: Secondary | ICD-10-CM

## 2017-04-30 ENCOUNTER — Other Ambulatory Visit: Payer: Self-pay | Admitting: Infectious Diseases

## 2017-04-30 DIAGNOSIS — Z1231 Encounter for screening mammogram for malignant neoplasm of breast: Secondary | ICD-10-CM

## 2017-05-01 DIAGNOSIS — M25511 Pain in right shoulder: Secondary | ICD-10-CM | POA: Insufficient documentation

## 2017-05-01 DIAGNOSIS — G8929 Other chronic pain: Secondary | ICD-10-CM | POA: Insufficient documentation

## 2017-06-05 ENCOUNTER — Other Ambulatory Visit: Payer: Self-pay | Admitting: Physician Assistant

## 2017-06-05 DIAGNOSIS — G8929 Other chronic pain: Secondary | ICD-10-CM

## 2017-06-05 DIAGNOSIS — M25512 Pain in left shoulder: Principal | ICD-10-CM

## 2017-06-12 ENCOUNTER — Ambulatory Visit
Admission: RE | Admit: 2017-06-12 | Discharge: 2017-06-12 | Disposition: A | Discharge status: Home / Self Care | Payer: Medicare Other | Source: Ambulatory Visit | Attending: Infectious Diseases | Admitting: Infectious Diseases

## 2017-06-12 DIAGNOSIS — Z1231 Encounter for screening mammogram for malignant neoplasm of breast: Secondary | ICD-10-CM | POA: Insufficient documentation

## 2017-06-15 ENCOUNTER — Ambulatory Visit
Admission: RE | Admit: 2017-06-15 | Discharge: 2017-06-15 | Disposition: A | Discharge status: Home / Self Care | Payer: Medicare Other | Source: Ambulatory Visit | Attending: Physician Assistant | Admitting: Physician Assistant

## 2017-06-15 DIAGNOSIS — M7552 Bursitis of left shoulder: Secondary | ICD-10-CM | POA: Diagnosis not present

## 2017-06-15 DIAGNOSIS — M25512 Pain in left shoulder: Secondary | ICD-10-CM | POA: Diagnosis present

## 2017-06-15 DIAGNOSIS — M7582 Other shoulder lesions, left shoulder: Secondary | ICD-10-CM | POA: Diagnosis not present

## 2017-06-15 DIAGNOSIS — G8929 Other chronic pain: Secondary | ICD-10-CM | POA: Insufficient documentation

## 2017-06-15 DIAGNOSIS — M24812 Other specific joint derangements of left shoulder, not elsewhere classified: Secondary | ICD-10-CM | POA: Insufficient documentation

## 2017-10-29 DIAGNOSIS — G8929 Other chronic pain: Secondary | ICD-10-CM | POA: Insufficient documentation

## 2017-11-24 ENCOUNTER — Other Ambulatory Visit: Payer: Self-pay

## 2017-11-24 ENCOUNTER — Emergency Department: Payer: Medicare Other

## 2017-11-24 ENCOUNTER — Encounter: Payer: Self-pay | Admitting: Emergency Medicine

## 2017-11-24 ENCOUNTER — Emergency Department
Admission: EM | Admit: 2017-11-24 | Discharge: 2017-11-24 | Disposition: A | Discharge status: Home / Self Care | Payer: Medicare Other | Attending: Emergency Medicine | Admitting: Emergency Medicine

## 2017-11-24 DIAGNOSIS — M25512 Pain in left shoulder: Secondary | ICD-10-CM | POA: Diagnosis not present

## 2017-11-24 DIAGNOSIS — Y999 Unspecified external cause status: Secondary | ICD-10-CM | POA: Diagnosis not present

## 2017-11-24 DIAGNOSIS — W108XXA Fall (on) (from) other stairs and steps, initial encounter: Secondary | ICD-10-CM | POA: Diagnosis not present

## 2017-11-24 DIAGNOSIS — Y9389 Activity, other specified: Secondary | ICD-10-CM | POA: Insufficient documentation

## 2017-11-24 DIAGNOSIS — I10 Essential (primary) hypertension: Secondary | ICD-10-CM | POA: Insufficient documentation

## 2017-11-24 DIAGNOSIS — M25552 Pain in left hip: Secondary | ICD-10-CM | POA: Diagnosis not present

## 2017-11-24 DIAGNOSIS — Y92195 Garage of other specified residential institution as the place of occurrence of the external cause: Secondary | ICD-10-CM | POA: Diagnosis not present

## 2017-11-24 DIAGNOSIS — Z87891 Personal history of nicotine dependence: Secondary | ICD-10-CM | POA: Diagnosis not present

## 2017-11-24 DIAGNOSIS — Z79899 Other long term (current) drug therapy: Secondary | ICD-10-CM | POA: Insufficient documentation

## 2017-11-24 DIAGNOSIS — W19XXXA Unspecified fall, initial encounter: Secondary | ICD-10-CM

## 2017-11-24 DIAGNOSIS — G8911 Acute pain due to trauma: Secondary | ICD-10-CM

## 2017-11-24 MED ORDER — NAPROXEN 500 MG PO TBEC: 500.0000 mg | DELAYED_RELEASE_TABLET | Freq: Two times a day (BID) | ORAL | 0 refills | Status: DC

## 2017-11-24 NOTE — ED Provider Notes (Signed)
[facility] Emergency Department Provider Note ____________________________________________  Time seen: 1020  I have reviewed the triage vital signs and the nursing notes.  HISTORY  Chief Complaint  Fall   HPI Jodi Stein is a 77 y.o. female presents to the ER with complaint of left shoulder pain and left hip pain status post a fall that occurred on Monday.  She reports she was walking out of her house and into her garage, when she missed a step and fell forward down 4 steps onto her left shoulder and left hip.  She was able to ambulate immediately after the fall.  She did not seek care at urgent care or from her PCP.  She reports the pain has worsened throughout the week which is why she is seeking care now.  She describes the pain is sore and achy in the left shoulder, worse with movement.  She denies numbness, tingling or weakness in the left arm.  She describes the left hip pain, as sore and achy as well.  It does not seem worse with sitting, standing, mild or walking.  She denies any numbness, tingling or weakness in the leg.  She has a history of chronic left shoulder pain, status post rotator cuff repair 8 to 10 years ago.  She has recently been referred to Ortho for left shoulder pain, by her PCP, but she has not yet set up this appointment.  She has tried naproxen over-the-counter with minimal relief.  Past Medical History:  Diagnosis Date  . Abdominal pain 2013  . Arthritis   . Bladder wall thickening   . Constipation   . Family history of adverse reaction to anesthesia   . GERD (gastroesophageal reflux disease)   . Hyperlipidemia   . Hypertension   . Obesity (BMI 30-39.9)   . Steatosis of liver     Patient Active Problem List   Diagnosis Date Noted  . Rectal carcinoid tumor     Past Surgical History:  Procedure Laterality Date  . ABDOMINAL HYSTERECTOMY    . CHOLECYSTECTOMY    . COLONOSCOPY WITH PROPOFOL N/A 02/08/2016   Procedure:  COLONOSCOPY WITH PROPOFOL;  Surgeon: Lollie Sails, MD;  Location: [facility] ENDOSCOPY;  Service: Endoscopy;  Laterality: N/A;  . EUS N/A 08/24/2016   Procedure: LOWER ENDOSCOPIC ULTRASOUND (EUS);  Surgeon: Milus Banister, MD;  Location: [facility] ENDOSCOPY;  Service: Endoscopy;  Laterality: N/A;  . EYE SURGERY    . FLEXIBLE SIGMOIDOSCOPY N/A 08/08/2016   Procedure: FLEXIBLE SIGMOIDOSCOPY;  Surgeon: Lollie Sails, MD;  Location: [facility] ENDOSCOPY;  Service: Endoscopy;  Laterality: N/A;  . HERNIA REPAIR  [id number]   umbilical  . HERNIA REPAIR  09/27/2012   ventral hernia  . ROTATOR CUFF REPAIR Left     Prior to Admission medications   Medication Sig Start Date End Date Taking? Authorizing Provider  lactulose (CHRONULAC) 10 GM/15ML solution Take 20 g by mouth daily as needed for mild constipation.     [provider]  lisinopril (PRINIVIL,ZESTRIL) 20 MG tablet Take 20 mg by mouth daily.    [provider]  naproxen (EC NAPROSYN) 500 MG EC tablet Take 1 tablet (500 mg total) by mouth 2 (two) times daily with a meal. 11/24/17   Tanay Misuraca, Coralie Keens, NP  omeprazole (PRILOSEC) 20 MG capsule Take 20 mg by mouth daily.    [provider]  simvastatin (ZOCOR) 40 MG tablet Take 40 mg by mouth daily.    [provider]  Allergies Codeine and Tramadol  Family History  Problem Relation Age of Onset  . Breast cancer Neg Hx     Social History Social History   Tobacco Use  . Smoking status: Former Smoker    Packs/day: 0.25    Years: 2.00    Pack years: 0.50    Types: Cigarettes    Last attempt to quit: 07/24/1961    Years since quitting: 56.3  . Smokeless tobacco: Never Used  Substance Use Topics  . Alcohol use: No  . Drug use: No    Review of Systems  Constitutional: Negative for fever. Musculoskeletal: Positive for left shoulder pain, left hip pain.  Negative for back pain. Skin: Negative for bruising. Neurological: Negative for headaches, focal weakness or  numbness. ____________________________________________  PHYSICAL EXAM:  VITAL SIGNS: ED Triage Vitals  Enc Vitals Group     BP 11/24/17 0921 (!) 165/88     Pulse Rate 11/24/17 0918 87     Resp 11/24/17 0918 15     Temp 11/24/17 0918 98.6 F (37 C)     Temp Source 11/24/17 0918 Oral     SpO2 11/24/17 0918 98 %     Weight 11/24/17 0921 178 lb (80.7 kg)     Height 11/24/17 0921 5\' 2"  (1.575 m)     Head Circumference --      Peak Flow --      Pain Score 11/24/17 0921 6     Pain Loc --      Pain Edu? --      Excl. in [place]? --     Constitutional: Alert and oriented. Well appearing and in no distress. Cardiovascular: Normal distal pulses. Musculoskeletal: Decreased internal rotation of the left shoulder.  Normal external rotation.  Pain with palpation over the left subacromial bursa.  Strength 5/5 bilateral upper extremities.  Handgrips equal.  Normal flexion, extension, internal and external rotation, abduction and abduction of the left hip.  Pain with palpation over the left trochanteric bursa.  Strength 5/5 bilateral lower extremities. Neurologic:  Normal gait without ataxia. Normal speech and language. No gross focal neurologic deficits are appreciated. Skin:  Skin is warm, dry and intact. No bruising noted.  _____________________________________________   GLOVFIEPP  Odette Horns Left Shoulder:    IMPRESSION:  1. No acute osseous abnormality.   ___________________________________________   INITIAL IMPRESSION / ASSESSMENT AND PLAN / ED COURSE  Acute on Chronic Left Shoulder Pain, Left Hip Pain s/p Fall:  Likely subacromial and trochanteric bursitis She is requesting x-ray of left shoulder today, but declines x-ray of left hip X-ray of left shoulder was negative She declines 9-day prior to taper due to side effects Rx provided for naproxen 500 mg twice daily with food Advised ice may be helpful Advised her to follow-up with PCP if pain persists or  worsens ____________________________________________  FINAL CLINICAL IMPRESSION(S) / ED DIAGNOSES  Final diagnoses:  Fall, initial encounter  Acute pain of left shoulder due to trauma  Left hip pain      Jearld Fenton, NP 11/24/17 1107    Lavonia Drafts, MD 11/24/17 1313

## 2017-11-24 NOTE — ED Triage Notes (Signed)
Tripped down one step last week.  C/O left hip and left shoulder pain.

## 2017-11-24 NOTE — Discharge Instructions (Signed)
Your x-ray today was negative for acute fracture.  You have been provided with a Rx for naproxen 500 mg to take twice a day with food.  Ice may be helpful.  Follow-up with PCP if symptoms persist or worsen.

## 2017-11-24 NOTE — ED Notes (Signed)
Pt able to ambulate to room. Pt states pain has been getting worse since fall that happened a week ago. Pt unable to raise left arm above shoulder height. States she has a shooting pain from shoulder down into arm

## 2018-04-03 ENCOUNTER — Encounter: Payer: Self-pay | Admitting: *Deleted

## 2018-04-04 ENCOUNTER — Ambulatory Visit: Payer: Medicare Other | Admitting: Certified Registered Nurse Anesthetist

## 2018-04-04 ENCOUNTER — Encounter: Payer: Self-pay | Admitting: *Deleted

## 2018-04-04 ENCOUNTER — Ambulatory Visit
Admission: RE | Admit: 2018-04-04 | Discharge: 2018-04-04 | Disposition: A | Discharge status: Home / Self Care | Payer: Medicare Other | Source: Ambulatory Visit | Attending: Gastroenterology | Admitting: Gastroenterology

## 2018-04-04 ENCOUNTER — Encounter
Admission: RE | Disposition: A | Discharge status: Home / Self Care | Payer: Self-pay | Source: Ambulatory Visit | Attending: Gastroenterology

## 2018-04-04 DIAGNOSIS — Z87891 Personal history of nicotine dependence: Secondary | ICD-10-CM | POA: Diagnosis not present

## 2018-04-04 DIAGNOSIS — E669 Obesity, unspecified: Secondary | ICD-10-CM | POA: Insufficient documentation

## 2018-04-04 DIAGNOSIS — Z885 Allergy status to narcotic agent status: Secondary | ICD-10-CM | POA: Insufficient documentation

## 2018-04-04 DIAGNOSIS — C7A026 Malignant carcinoid tumor of the rectum: Secondary | ICD-10-CM | POA: Insufficient documentation

## 2018-04-04 DIAGNOSIS — K5730 Diverticulosis of large intestine without perforation or abscess without bleeding: Secondary | ICD-10-CM | POA: Diagnosis not present

## 2018-04-04 DIAGNOSIS — Z888 Allergy status to other drugs, medicaments and biological substances status: Secondary | ICD-10-CM | POA: Diagnosis not present

## 2018-04-04 DIAGNOSIS — K219 Gastro-esophageal reflux disease without esophagitis: Secondary | ICD-10-CM | POA: Insufficient documentation

## 2018-04-04 DIAGNOSIS — Z79899 Other long term (current) drug therapy: Secondary | ICD-10-CM | POA: Insufficient documentation

## 2018-04-04 DIAGNOSIS — Z6832 Body mass index (BMI) 32.0-32.9, adult: Secondary | ICD-10-CM | POA: Insufficient documentation

## 2018-04-04 DIAGNOSIS — I10 Essential (primary) hypertension: Secondary | ICD-10-CM | POA: Diagnosis not present

## 2018-04-04 DIAGNOSIS — M1990 Unspecified osteoarthritis, unspecified site: Secondary | ICD-10-CM | POA: Diagnosis not present

## 2018-04-04 DIAGNOSIS — E785 Hyperlipidemia, unspecified: Secondary | ICD-10-CM | POA: Insufficient documentation

## 2018-04-04 DIAGNOSIS — K760 Fatty (change of) liver, not elsewhere classified: Secondary | ICD-10-CM | POA: Insufficient documentation

## 2018-04-04 DIAGNOSIS — Z85040 Personal history of malignant carcinoid tumor of rectum: Secondary | ICD-10-CM | POA: Diagnosis not present

## 2018-04-04 DIAGNOSIS — Z08 Encounter for follow-up examination after completed treatment for malignant neoplasm: Secondary | ICD-10-CM | POA: Diagnosis present

## 2018-04-04 HISTORY — PX: FLEXIBLE SIGMOIDOSCOPY: SHX5431

## 2018-04-04 HISTORY — DX: Other benign neuroendocrine tumors: D3A.8

## 2018-04-04 HISTORY — DX: Diverticulosis of intestine, part unspecified, without perforation or abscess without bleeding: K57.90

## 2018-04-04 SURGERY — SIGMOIDOSCOPY, FLEXIBLE
Anesthesia: General

## 2018-04-04 MED ORDER — SODIUM CHLORIDE 0.9 % IV SOLN
INTRAVENOUS | Status: DC
  Administered 2018-04-04: 11:00:00 via INTRAVENOUS

## 2018-04-04 MED ORDER — PROPOFOL 500 MG/50ML IV EMUL
INTRAVENOUS | Status: DC | PRN
  Administered 2018-04-04: 150 ug/kg/min via INTRAVENOUS

## 2018-04-04 MED ORDER — PROPOFOL 10 MG/ML IV BOLUS
INTRAVENOUS | Status: DC | PRN
  Administered 2018-04-04: 60 mg via INTRAVENOUS
  Administered 2018-04-04: 20 mg via INTRAVENOUS
  Administered 2018-04-04: 10 mg via INTRAVENOUS
  Administered 2018-04-04: 20 mg via INTRAVENOUS

## 2018-04-04 MED ORDER — SODIUM CHLORIDE 0.9 % IV SOLN: INTRAVENOUS | Status: DC

## 2018-04-04 NOTE — H&P (Signed)
Outpatient short stay form Pre-procedure 04/04/2018 12:15 PM Lollie Sails MD  Primary Physician: Adrian Prows, MD  Reason for visit: Sedated flexible sigmoidoscopy  History of present illness: Patient is a 77 year old female presenting today for follow-up evaluation regards her history of rectal carcinoid.  Initially was noted to have this on colonoscopy 02/08/2016.  She had this followed up with a flexible sigmoidoscopy after initial removal.  Biopsies indicated some residual carcinoid/neuroendocrine tumor.  Currently this was again removed at the time flexible sigmoidoscopy and followed afterwards with a trans-anal rectal ultrasound.  Is no evidence of recurrent lesion at that time.  She is presenting today for recheck of this area.  There was some ink tattooed on this adjacent to this area at the time of flexible sigmoidoscopy.  Patient tolerated her prep for today well.  He has held any aspirin product.  She takes no blood thinner.    Current Facility-Administered Medications:  .  0.9 %  sodium chloride infusion, , Intravenous, Continuous, Lollie Sails, MD .  0.9 %  sodium chloride infusion, , Intravenous, Continuous, Lollie Sails, MD, Last Rate: 20 mL/hr at 04/04/18 1114  Medications Prior to Admission  Medication Sig Dispense Refill Last Dose  . atorvastatin (LIPITOR) 10 MG tablet Take 10 mg by mouth daily.     Marland Kitchen ibuprofen (ADVIL,MOTRIN) 800 MG tablet Take 800 mg by mouth every 6 (six) hours as needed.     Marland Kitchen lisinopril (PRINIVIL,ZESTRIL) 20 MG tablet Take 20 mg by mouth daily.   04/04/2018 at Unknown time  . polyethylene glycol (MIRALAX / GLYCOLAX) packet Take 17 g by mouth daily. Mix in 4-8oz fluid   04/02/2018  . lactulose (CHRONULAC) 10 GM/15ML solution Take 20 g by mouth daily as needed for mild constipation.    Not Taking at Unknown time  . naproxen (EC NAPROSYN) 500 MG EC tablet Take 1 tablet (500 mg total) by mouth 2 (two) times daily with a meal. (Patient not  taking: Reported on 04/04/2018) 30 tablet 0 Not Taking at Unknown time  . omeprazole (PRILOSEC) 20 MG capsule Take 20 mg by mouth daily.   04/02/2018  . simvastatin (ZOCOR) 40 MG tablet Take 40 mg by mouth daily.   08/22/2016     Allergies  Allergen Reactions  . Codeine Nausea And Vomiting  . Tramadol Nausea And Vomiting     Past Medical History:  Diagnosis Date  . Abdominal pain 2013  . Arthritis   . Bladder wall thickening   . Constipation   . Diverticulosis   . Family history of adverse reaction to anesthesia   . GERD (gastroesophageal reflux disease)   . Hyperlipidemia   . Hypertension   . Neuroendocrine tumor    rectal  . Obesity (BMI 30-39.9)   . Steatosis of liver     Review of systems:      Physical Exam    Heart and lungs: Regular rate and rhythm without rub or gallop, lungs are bilaterally clear.    HEENT: Normocephalic atraumatic eyes are anicteric    Other:    Pertinant exam for procedure: Soft nontender nondistended bowel sounds positive normoactive    Planned proceedures: Lexical sigmoidoscopy, sedated, and indicated procedures. I have discussed the risks benefits and complications of procedures to include not limited to bleeding, infection, perforation and the risk of sedation and the patient wishes to proceed.    Lollie Sails, MD Gastroenterology 04/04/2018  12:15 PM

## 2018-04-04 NOTE — Anesthesia Preprocedure Evaluation (Signed)
Anesthesia Evaluation  Patient identified by MRN, date of birth, ID band Patient awake    Reviewed: Allergy & Precautions, H&P , NPO status , Patient's Chart, lab work & pertinent test results, reviewed documented beta blocker date and time   History of Anesthesia Complications Negative for: history of anesthetic complications  Airway Mallampati: II   Neck ROM: full    Dental  (+) Teeth Intact, Dental Advidsory Given, Partial Upper   Pulmonary neg pulmonary ROS, former smoker,           Cardiovascular Exercise Tolerance: Good hypertension, On Medications (-) angina(-) CAD, (-) Past MI, (-) Cardiac Stents and (-) CABG (-) dysrhythmias (-) Valvular Problems/Murmurs     Neuro/Psych negative neurological ROS  negative psych ROS   GI/Hepatic negative GI ROS, GERD  Medicated,NAFLD   Endo/Other  negative endocrine ROS  Renal/GU negative Renal ROS  negative genitourinary   Musculoskeletal   Abdominal   Peds  Hematology negative hematology ROS (+)   Anesthesia Other Findings Past Medical History:   Abdominal pain                                  2013         Bladder wall thickening                                      Constipation                                                 Hyperlipidemia                                               Hypertension                                                 Arthritis                                                    Obesity (BMI 30-39.9)                                        GERD (gastroesophageal reflux disease)                       Family history of adverse reaction to anesthes*            Past Surgical History:   EYE SURGERY  ABDOMINAL HYSTERECTOMY                                        CHOLECYSTECTOMY                                               ROTATOR CUFF REPAIR                             Left              HERNIA  REPAIR                                    2013           Comment:umbilical   HERNIA REPAIR                                    09/27/2012       Comment:ventral hernia BMI    Body Mass Index   32.54 kg/m 2     Reproductive/Obstetrics negative OB ROS                             Anesthesia Physical  Anesthesia Plan  ASA: III  Anesthesia Plan: General   Post-op Pain Management:    Induction: Intravenous  PONV Risk Score and Plan: 3 and Propofol infusion and TIVA  Airway Management Planned: Nasal Cannula and Natural Airway  Additional Equipment:   Intra-op Plan:   Post-operative Plan:   Informed Consent: I have reviewed the patients History and Physical, chart, labs and discussed the procedure including the risks, benefits and alternatives for the proposed anesthesia with the patient or authorized representative who has indicated his/her understanding and acceptance.   Dental Advisory Given  Plan Discussed with: CRNA  Anesthesia Plan Comments:         Anesthesia Quick Evaluation

## 2018-04-04 NOTE — Progress Notes (Signed)
This note also relates to the following rows which could not be included: Pulse Rate - Cannot attach notes to unvalidated device data ECG Heart Rate - Cannot attach notes to unvalidated device data Resp - Cannot attach notes to unvalidated device data BP - Cannot attach notes to unvalidated device data SpO2 - Cannot attach notes to unvalidated device data   

## 2018-04-04 NOTE — Op Note (Signed)
[facility] Gastroenterology Patient Name: Jodi Stein Procedure Date: 04/04/2018 12:14 PM MRN: [id number] Account #: [id number] Date of Birth: 1940/11/24 Admit Type: Outpatient Age: 77 Room: [facility] ENDO ROOM 1 Gender: Female Note Status: Finalized Procedure:            Flexible Sigmoidoscopy Indications:          Personal history of rectal carcinoid. Providers:            Lollie Sails, MD Referring MD:         Baxter Hire, MD (Referring MD) Medicines:            Monitored Anesthesia Care Complications:        No immediate complications. Procedure:            Pre-Anesthesia Assessment:                       - ASA Grade Assessment: III - A patient with severe                        systemic disease.                       After obtaining informed consent, the scope was passed                        under direct vision. The Colonoscope was introduced                        through the anus and advanced to the 30 cm from the                        anal verge. The flexible sigmoidoscopy was accomplished                        without difficulty. The patient tolerated the procedure                        well. The patient tolerated the procedure well. Findings:      The digital rectal exam was normal.      The scope was advanced to about 30 cm from the anal verge. Multiple       medium diverticula were noted in the distal sigmoid. The scope was       retrieved to the rectal vault and the site of the previously noted       lesion was redily found, with a small mucosal bump between two tiny ink       marks. Multiple biopsies were taken of the site in a biopsy on biopsy       fashion. Evaluation of the rectum was otherwise normal. Impression:           - No specimens collected. Recommendation:       - Soft diet today, then advance as tolerated to advance                        diet as tolerated. Procedure Code(s):    --- Professional ---  [phone], Sigmoidoscopy, flexible; diagnostic, including                        collection of specimen(s) by brushing or washing, when  performed (separate procedure) CPT copyright 2017 American Medical Association. All rights reserved. The codes documented in this report are preliminary and upon coder review may  be revised to meet current compliance requirements. Lollie Sails, MD 04/04/2018 12:49:44 PM This report has been signed electronically. Number of Addenda: 0 Note Initiated On: 04/04/2018 12:14 PM Total Procedure Duration: 0 hours 14 minutes 45 seconds       [facility]

## 2018-04-04 NOTE — Anesthesia Post-op Follow-up Note (Signed)
Anesthesia QCDR form completed.        

## 2018-04-04 NOTE — Anesthesia Postprocedure Evaluation (Signed)
Anesthesia Post Note  Patient: Jodi Stein  Procedure(s) Performed: FLEXIBLE SIGMOIDOSCOPY (N/A )  Patient location during evaluation: Endoscopy Anesthesia Type: General Level of consciousness: awake and alert Pain management: pain level controlled Vital Signs Assessment: post-procedure vital signs reviewed and stable Respiratory status: spontaneous breathing, nonlabored ventilation, respiratory function stable and patient connected to nasal cannula oxygen Cardiovascular status: blood pressure returned to baseline and stable Postop Assessment: no apparent nausea or vomiting Anesthetic complications: no     Last Vitals:  Vitals:   04/04/18 1246 04/04/18 1256  BP: 107/64 115/72  Pulse: 82 82  Resp: 18 (!) 21  Temp: (!) 36.1 C   SpO2: 99% 99%    Last Pain:  Vitals:   04/04/18 1256  TempSrc:   PainSc: 0-No pain                 Martha Clan

## 2018-04-04 NOTE — Transfer of Care (Signed)
Immediate Anesthesia Transfer of Care Note  Patient: Jodi Stein  Procedure(s) Performed: FLEXIBLE SIGMOIDOSCOPY (N/A )  Patient Location: PACU  Anesthesia Type:General  Level of Consciousness: drowsy  Airway & Oxygen Therapy: Patient Spontanous Breathing and Patient connected to nasal cannula oxygen  Post-op Assessment: Report given to RN and Post -op Vital signs reviewed and stable  Post vital signs: Reviewed and stable  Last Vitals:  Vitals Value Taken Time  BP    Temp    Pulse    Resp    SpO2      Last Pain:  Vitals:   04/04/18 1044  TempSrc: Tympanic         Complications: No apparent anesthesia complications

## 2018-04-04 NOTE — Anesthesia Procedure Notes (Signed)
Performed by: Katrisha Segall, CRNA Pre-anesthesia Checklist: Patient identified, Emergency Drugs available, Suction available, Patient being monitored and Timeout performed Patient Re-evaluated:Patient Re-evaluated prior to induction Oxygen Delivery Method: Nasal cannula Induction Type: IV induction       

## 2018-04-05 LAB — SURGICAL PATHOLOGY

## 2018-04-08 ENCOUNTER — Encounter: Payer: Self-pay | Admitting: Gastroenterology

## 2018-04-18 ENCOUNTER — Telehealth: Payer: Self-pay

## 2018-04-18 NOTE — Telephone Encounter (Signed)
Received referral from Dr. 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 Contacted Jodi Stein and she had several questions regarding NET. Answered all of her questions as well as reviewed her pathology with her from recent colonoscopy. We scheduled EUS for 05/09/18 at [facility] with Dr. Mont Dutton. Went over all instructions and prep. Copy mailed to home address. Denies anticoagulants and diabetes. INSTRUCTIONS FOR ENDOSCOPIC ULTRASOUND     -Your procedure has been scheduled for October 17th with Dr. Francella Solian at [facility] will contact you to pre-register over the phone.    -To get your scheduled arrival time, please call the Endoscopy unit at  [phone] between 1-3pm on: October 16th  - Instructions for Miralax-Gatorade Bowel Preparation are enclosed. To ensure a successful exam, please follow the instructions carefully.   -Begin your bowel prep as instructed on October 16th       -ON THE DAY OF YOU PROCEDURE:           1.  You may take your heart, seizure, blood pressure, Parkinson's or breathing              medications at 6am with just enough water to get your pills down         2.  Do not take any oral Diabetic medications the morning of your procedure.         3.  If you are a diabetic and are using insulin, please notify your prescribing                 physician of this procedure as your dose may need to be altered for this                 procedure                4. Do not take Vitamin E or fish oil for 7 days prior to exam                -On the day of your procedure, come to the [facility] Admitting/Registration desk (First desk on the right) at the scheduled arrival time.   You MUST have someone drive you home from your procedure. You must have a responsible adult with a valid driver's license who is on site throughout your entire procedure and who can stay with you for several hours after your procedure. You may not go home  alone in a taxi, shuttle [place] or bus, as the drivers will not be responsible for you.   --If you have any questions please call me at the above contact    Oncology Nurse Navigator Documentation  Navigator Location: [facility] (04/18/18 1500)   )Navigator Encounter Type: Telephone (04/18/18 1500) Telephone: Outgoing Call (04/18/18 1500)                       Barriers/Navigation Needs: Coordination of Care (04/18/18 1500)   Interventions: Coordination of Care (04/18/18 1500)   Coordination of Care: EUS (04/18/18 1500)                  Time Spent with Patient: 30 (04/18/18 1500)

## 2018-04-19 ENCOUNTER — Telehealth: Payer: Self-pay

## 2018-04-19 NOTE — Telephone Encounter (Signed)
Received call from Jodi Stein. She is requesting a sooner date than 10/17 for EUS and would like it performed in [place], stating she has a sour taste for . She has been given Dr. Eustace Quail Burbridge information to research and I will go ahead and reach out to Dr. Eugenia Pancoast team in [place], as he performed her last EUS in 2018. Oncology Nurse Navigator Documentation  Navigator Location: [facility] (04/19/18 0800)   )Navigator Encounter Type: Telephone (04/19/18 0800) Telephone: Incoming Call (04/19/18 0800)                               Coordination of Care: EUS (04/19/18 0800)                  Time Spent with Patient: 15 (04/19/18 0800)

## 2018-04-26 ENCOUNTER — Telehealth: Payer: Self-pay

## 2018-04-26 NOTE — Telephone Encounter (Signed)
Received call back from Ms. Grand. [place] was not able to schedule her any earlier than 10/17 so she has decided to have procedure here at [facility]. Reviewed instructions and prep again and copy mailed to home address. Oncology Nurse Navigator Documentation  Navigator Location: [facility] (04/26/18 0900)   )Navigator Encounter Type: Telephone (04/26/18 0900) Telephone: Incoming Call;Appt Confirmation/Clarification (04/26/18 0900)                               Coordination of Care: EUS (04/26/18 0900)                  Time Spent with Patient: 15 (04/26/18 0900)

## 2018-05-06 ENCOUNTER — Telehealth: Payer: Self-pay

## 2018-05-06 NOTE — Telephone Encounter (Signed)
Received call from Dr. Mont Dutton. She will not be able to perform mucosal resection of rectal NET here at [facility]. I have spoken to Jodi Stein and she is agreeable to have case transferred to [place]. I have contacted her team at [facility] and they will contact [name] for scheduling. Oncology Nurse Navigator Documentation  Navigator Location: [facility] (05/06/18 1000)   )Navigator Encounter Type: Telephone (05/06/18 1000) Telephone: Incoming Call;Outgoing Call (05/06/18 1000)                               Coordination of Care: EUS (05/06/18 1000)                  Time Spent with Patient: 30 (05/06/18 1000)

## 2018-05-09 ENCOUNTER — Ambulatory Visit: Admit: 2018-05-09 | Payer: 59 | Admitting: Internal Medicine

## 2018-05-09 SURGERY — ULTRASOUND, LOWER GI TRACT, ENDOSCOPIC
Anesthesia: General

## 2018-06-13 ENCOUNTER — Other Ambulatory Visit: Payer: Self-pay | Admitting: Internal Medicine

## 2018-06-13 DIAGNOSIS — Z1231 Encounter for screening mammogram for malignant neoplasm of breast: Secondary | ICD-10-CM

## 2018-07-22 ENCOUNTER — Ambulatory Visit
Admission: RE | Admit: 2018-07-22 | Discharge: 2018-07-22 | Disposition: A | Discharge status: Home / Self Care | Payer: Medicare Other | Source: Ambulatory Visit | Attending: Internal Medicine | Admitting: Internal Medicine

## 2018-07-22 DIAGNOSIS — Z1231 Encounter for screening mammogram for malignant neoplasm of breast: Secondary | ICD-10-CM | POA: Insufficient documentation

## 2018-08-21 ENCOUNTER — Encounter: Payer: Self-pay | Admitting: Emergency Medicine

## 2018-08-21 ENCOUNTER — Emergency Department: Payer: No Typology Code available for payment source

## 2018-08-21 ENCOUNTER — Emergency Department
Admission: EM | Admit: 2018-08-21 | Discharge: 2018-08-21 | Disposition: A | Discharge status: Home / Self Care | Payer: No Typology Code available for payment source | Attending: Emergency Medicine | Admitting: Emergency Medicine

## 2018-08-21 ENCOUNTER — Other Ambulatory Visit: Payer: Self-pay

## 2018-08-21 DIAGNOSIS — Y939 Activity, unspecified: Secondary | ICD-10-CM | POA: Diagnosis not present

## 2018-08-21 DIAGNOSIS — Y92410 Unspecified street and highway as the place of occurrence of the external cause: Secondary | ICD-10-CM | POA: Insufficient documentation

## 2018-08-21 DIAGNOSIS — Y999 Unspecified external cause status: Secondary | ICD-10-CM | POA: Insufficient documentation

## 2018-08-21 DIAGNOSIS — Z87891 Personal history of nicotine dependence: Secondary | ICD-10-CM | POA: Insufficient documentation

## 2018-08-21 DIAGNOSIS — I10 Essential (primary) hypertension: Secondary | ICD-10-CM | POA: Diagnosis not present

## 2018-08-21 DIAGNOSIS — M545 Low back pain, unspecified: Secondary | ICD-10-CM

## 2018-08-21 DIAGNOSIS — Z79899 Other long term (current) drug therapy: Secondary | ICD-10-CM | POA: Diagnosis not present

## 2018-08-21 DIAGNOSIS — M25561 Pain in right knee: Secondary | ICD-10-CM | POA: Diagnosis not present

## 2018-08-21 MED ORDER — MELOXICAM 15 MG PO TABS: 15.0000 mg | ORAL_TABLET | Freq: Every day | ORAL | 0 refills | Status: AC

## 2018-08-21 MED ORDER — LIDOCAINE 5 % EX PTCH: 1.0000 | MEDICATED_PATCH | CUTANEOUS | 0 refills | Status: DC

## 2018-08-21 MED ORDER — IBUPROFEN 400 MG PO TABS: 400.0000 mg | ORAL_TABLET | Freq: Four times a day (QID) | ORAL | 0 refills | Status: DC | PRN
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
## 2018-10-30 ENCOUNTER — Other Ambulatory Visit: Payer: Self-pay | Admitting: Sports Medicine

## 2018-10-30 DIAGNOSIS — G8929 Other chronic pain: Secondary | ICD-10-CM

## 2018-10-30 DIAGNOSIS — M5442 Lumbago with sciatica, left side: Principal | ICD-10-CM

## 2018-10-30 DIAGNOSIS — M4727 Other spondylosis with radiculopathy, lumbosacral region: Secondary | ICD-10-CM

## 2018-12-03 ENCOUNTER — Other Ambulatory Visit: Payer: Self-pay

## 2018-12-03 ENCOUNTER — Ambulatory Visit
Admission: RE | Admit: 2018-12-03 | Discharge: 2018-12-03 | Disposition: A | Discharge status: Home / Self Care | Payer: No Typology Code available for payment source | Source: Ambulatory Visit | Attending: Sports Medicine | Admitting: Sports Medicine

## 2018-12-03 DIAGNOSIS — M5442 Lumbago with sciatica, left side: Secondary | ICD-10-CM | POA: Insufficient documentation

## 2018-12-03 DIAGNOSIS — G8929 Other chronic pain: Secondary | ICD-10-CM | POA: Insufficient documentation

## 2018-12-03 DIAGNOSIS — M4727 Other spondylosis with radiculopathy, lumbosacral region: Secondary | ICD-10-CM | POA: Diagnosis present

## 2018-12-03 DIAGNOSIS — M5126 Other intervertebral disc displacement, lumbar region: Secondary | ICD-10-CM | POA: Diagnosis not present
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
## 2019-08-08 DIAGNOSIS — R600 Localized edema: Secondary | ICD-10-CM | POA: Insufficient documentation

## 2019-08-22 DIAGNOSIS — I480 Paroxysmal atrial fibrillation: Secondary | ICD-10-CM | POA: Insufficient documentation

## 2019-08-22 DIAGNOSIS — I4891 Unspecified atrial fibrillation: Secondary | ICD-10-CM | POA: Insufficient documentation

## 2019-09-13 ENCOUNTER — Other Ambulatory Visit: Payer: Self-pay

## 2019-09-13 ENCOUNTER — Emergency Department
Admission: EM | Admit: 2019-09-13 | Discharge: 2019-09-13 | Disposition: A | Discharge status: Home / Self Care | Payer: Medicare Other | Attending: Student | Admitting: Student

## 2019-09-13 ENCOUNTER — Encounter: Payer: Self-pay | Admitting: Emergency Medicine

## 2019-09-13 DIAGNOSIS — I10 Essential (primary) hypertension: Secondary | ICD-10-CM | POA: Diagnosis not present

## 2019-09-13 DIAGNOSIS — Z87891 Personal history of nicotine dependence: Secondary | ICD-10-CM | POA: Diagnosis not present

## 2019-09-13 DIAGNOSIS — R040 Epistaxis: Secondary | ICD-10-CM | POA: Insufficient documentation

## 2019-09-13 DIAGNOSIS — Z79899 Other long term (current) drug therapy: Secondary | ICD-10-CM | POA: Insufficient documentation

## 2019-09-13 HISTORY — DX: Unspecified atrial fibrillation: I48.91

## 2019-09-13 NOTE — ED Provider Notes (Signed)
Emergency Department Provider Note  ____________________________________________  Time seen: Approximately 10:31 PM  I have reviewed the triage vital signs and the nursing notes.   HISTORY  Chief Complaint Epistaxis   Historian Patient    HPI Jodi Stein is a 79 y.o. female presents to the emergency department after epistaxis started from the right nare.  Patient states that epistaxis lasted approximately 45 minutes.  Patient is currently taking Eliquis for A. fib.  She reports that she has not had a recent nosebleed in the past several months.  Denies use of intranasal no sprays or other nose trauma.  Patient states that epistaxis resolved spontaneously after ice application.  No other alleviating measures have been attempted.   Past Medical History:  Diagnosis Date  . A-fib ([place])   . Abdominal pain 2013  . Arthritis   . Bladder wall thickening   . Constipation   . Diverticulosis   . Family history of adverse reaction to anesthesia   . GERD (gastroesophageal reflux disease)   . Hyperlipidemia   . Hypertension   . Neuroendocrine tumor    rectal  . Obesity (BMI 30-39.9)   . Steatosis of liver      Immunizations up to date:  Yes.     Past Medical History:  Diagnosis Date  . A-fib ([place])   . Abdominal pain 2013  . Arthritis   . Bladder wall thickening   . Constipation   . Diverticulosis   . Family history of adverse reaction to anesthesia   . GERD (gastroesophageal reflux disease)   . Hyperlipidemia   . Hypertension   . Neuroendocrine tumor    rectal  . Obesity (BMI 30-39.9)   . Steatosis of liver     Patient Active Problem List   Diagnosis Date Noted  . Rectal carcinoid tumor     Past Surgical History:  Procedure Laterality Date  . ABDOMINAL HYSTERECTOMY    . CHOLECYSTECTOMY    . COLONOSCOPY WITH PROPOFOL N/A 02/08/2016   Procedure: COLONOSCOPY WITH PROPOFOL;  Surgeon: Lollie Sails, MD;  Location: [facility] ENDOSCOPY;  Service: Endoscopy;   Laterality: N/A;  . EUS N/A 08/24/2016   Procedure: LOWER ENDOSCOPIC ULTRASOUND (EUS);  Surgeon: Milus Banister, MD;  Location: [facility] ENDOSCOPY;  Service: Endoscopy;  Laterality: N/A;  . EYE SURGERY    . FLEXIBLE SIGMOIDOSCOPY N/A 08/08/2016   Procedure: FLEXIBLE SIGMOIDOSCOPY;  Surgeon: Lollie Sails, MD;  Location: [facility] ENDOSCOPY;  Service: Endoscopy;  Laterality: N/A;  . FLEXIBLE SIGMOIDOSCOPY N/A 04/04/2018   Procedure: FLEXIBLE SIGMOIDOSCOPY;  Surgeon: Lollie Sails, MD;  Location: [facility] ENDOSCOPY;  Service: Endoscopy;  Laterality: N/A;  . HERNIA REPAIR  0000000   umbilical  . HERNIA REPAIR  09/27/2012   ventral hernia  . ROTATOR CUFF REPAIR Left     Prior to Admission medications   Medication Sig Start Date End Date Taking? Authorizing Provider  atorvastatin (LIPITOR) 10 MG tablet Take 10 mg by mouth daily.    [provider]  lactulose (CHRONULAC) 10 GM/15ML solution Take 20 g by mouth daily as needed for mild constipation.     [provider]  lidocaine (LIDODERM) 5 % Place 1 patch onto the skin daily. Remove & Discard patch within 12 hours or as directed by MD 08/21/18   Laban Emperor, PA-C  lisinopril (PRINIVIL,ZESTRIL) 20 MG tablet Take 20 mg by mouth daily.    [provider]  naproxen (EC NAPROSYN) 500 MG EC tablet Take 1 tablet (500 mg  total) by mouth 2 (two) times daily with a meal. Patient not taking: Reported on 04/04/2018 11/24/17   Jearld Fenton, NP  omeprazole (PRILOSEC) 20 MG capsule Take 20 mg by mouth daily.    [provider]  polyethylene glycol (MIRALAX / GLYCOLAX) packet Take 17 g by mouth daily. Mix in 4-8oz fluid    [provider]  simvastatin (ZOCOR) 40 MG tablet Take 40 mg by mouth daily.    [provider]    Allergies Codeine and Tramadol  Family History  Problem Relation Age of Onset  . Breast cancer Neg Hx     Social History Social History   Tobacco Use  . Smoking status: Former Smoker     Packs/day: 0.25    Years: 2.00    Pack years: 0.50    Types: Cigarettes    Quit date: 07/24/1961    Years since quitting: 58.1  . Smokeless tobacco: Never Used  Substance Use Topics  . Alcohol use: No  . Drug use: No     Review of Systems  Constitutional: No fever/chills Eyes:  No discharge ENT: Patient has resolved epistaxis.  Respiratory: no cough. No SOB/ use of accessory muscles to breath Gastrointestinal:   No nausea, no vomiting.  No diarrhea.  No constipation. Musculoskeletal: Negative for musculoskeletal pain. Skin: Negative for rash, abrasions, lacerations, ecchymosis.    ____________________________________________   PHYSICAL EXAM:  VITAL SIGNS: ED Triage Vitals [09/13/19 2127]  Enc Vitals Group     BP 125/65     Pulse Rate 94     Resp 18     Temp 99 F (37.2 C)     Temp Source Oral     SpO2 99 %     Weight 179 lb (81.2 kg)     Height 5\' 3"  (1.6 m)     Head Circumference      Peak Flow      Pain Score 0     Pain Loc      Pain Edu?      Excl. in [place]?      Constitutional: Alert and oriented. Well appearing and in no acute distress. Eyes: Conjunctivae are normal. PERRL. EOMI. Head: Atraumatic. ENT:      Ears:       Nose: No congestion/rhinnorhea.  Trace dried blood at the right nare visualized.      Mouth/Throat: Mucous membranes are moist.  Neck: No stridor.  No cervical spine tenderness to palpation. Cardiovascular: Normal rate, regular rhythm. Normal S1 and S2.  Good peripheral circulation. Respiratory: Normal respiratory effort without tachypnea or retractions. Lungs CTAB. Good air entry to the bases with no decreased or absent breath sounds Skin:  Skin is warm, dry and intact. No rash noted. Psychiatric: Mood and affect are normal for age. Speech and behavior are normal.   ____________________________________________   LABS (all labs ordered are listed, but only abnormal results are displayed)  Labs Reviewed - No data to  display ____________________________________________  EKG   ____________________________________________  RADIOLOGY   No results found.  ____________________________________________    PROCEDURES  Procedure(s) performed:     Procedures     Medications - No data to display   ____________________________________________   INITIAL IMPRESSION / ASSESSMENT AND PLAN / ED COURSE  Pertinent labs & imaging results that were available during my care of the patient were reviewed by me and considered in my medical decision making (see chart for details).    Assessment and Plan:  Epistaxis 79 year old female presents to the emergency department after resolved epistaxis.  Patient reports that epistaxis lasted approximately 45 minutes and then resolved spontaneously.  No active bleeding occurred in the emergency department.      Return precautions were given to return with new or worsening symptoms.  All patient questions were answered.    ____________________________________________  FINAL CLINICAL IMPRESSION(S) / ED DIAGNOSES  Final diagnoses:  Epistaxis      NEW MEDICATIONS STARTED DURING THIS VISIT:  ED Discharge Orders    None          This chart was dictated using voice recognition software/Dragon. Despite best efforts to proofread, errors can occur which can change the meaning. Any change was purely unintentional.     Lannie Fields, PA-C 09/13/19 2234    Lilia Pro., MD 09/14/19 [phone]

## 2019-09-13 NOTE — ED Triage Notes (Addendum)
Patient ambulatory to triage with complaints of nose bleed that started at 2000 and bled for approx 40 mins "running"; pt reports taking eliquis for Afib, pt reports just "sitting on the couch watching TV" at the time.    Pt reports that nose stopped bleeding on the way to ED; family waiting in parking lot  Pt reports taking nighttime medications tonight before coming to ED.  Speaking in complete coherent sentences. No acute breathing distress noted.

## 2019-09-13 NOTE — ED Notes (Signed)
Pt states had a nose bleed around 8pm which she was able to control by applying ice. Pt is on eliquis. Bleeding is still controlled.

## 2019-09-15 ENCOUNTER — Other Ambulatory Visit: Payer: Self-pay | Admitting: Internal Medicine

## 2019-09-15 DIAGNOSIS — Z1231 Encounter for screening mammogram for malignant neoplasm of breast: Secondary | ICD-10-CM

## 2019-09-17 DIAGNOSIS — G629 Polyneuropathy, unspecified: Secondary | ICD-10-CM | POA: Insufficient documentation

## 2019-10-15 ENCOUNTER — Ambulatory Visit
Admission: RE | Admit: 2019-10-15 | Discharge: 2019-10-15 | Disposition: A | Discharge status: Home / Self Care | Payer: Medicare Other | Source: Ambulatory Visit | Attending: Internal Medicine | Admitting: Internal Medicine

## 2019-10-15 DIAGNOSIS — Z1231 Encounter for screening mammogram for malignant neoplasm of breast: Secondary | ICD-10-CM | POA: Diagnosis present

## 2019-11-11 ENCOUNTER — Other Ambulatory Visit: Payer: Self-pay | Admitting: Neurology

## 2019-11-11 DIAGNOSIS — M542 Cervicalgia: Secondary | ICD-10-CM

## 2019-11-11 DIAGNOSIS — R292 Abnormal reflex: Secondary | ICD-10-CM

## 2019-11-28 ENCOUNTER — Other Ambulatory Visit: Payer: Self-pay | Admitting: Student

## 2019-12-08 ENCOUNTER — Ambulatory Visit
Admission: RE | Admit: 2019-12-08 | Discharge: 2019-12-08 | Disposition: A | Discharge status: Home / Self Care | Payer: Medicare Other | Source: Ambulatory Visit | Attending: Neurology | Admitting: Neurology

## 2019-12-08 ENCOUNTER — Other Ambulatory Visit: Payer: Self-pay

## 2019-12-08 DIAGNOSIS — M542 Cervicalgia: Secondary | ICD-10-CM

## 2019-12-08 DIAGNOSIS — R292 Abnormal reflex: Secondary | ICD-10-CM

## 2019-12-16 ENCOUNTER — Ambulatory Visit (INDEPENDENT_AMBULATORY_CARE_PROVIDER_SITE_OTHER): Payer: Medicare Other | Admitting: Vascular Surgery

## 2019-12-16 ENCOUNTER — Encounter (INDEPENDENT_AMBULATORY_CARE_PROVIDER_SITE_OTHER): Payer: Self-pay | Admitting: Vascular Surgery

## 2019-12-16 ENCOUNTER — Other Ambulatory Visit: Payer: Self-pay

## 2019-12-16 VITALS — BP 119/65 | HR 87 | Ht 62.0 in | Wt 194.0 lb

## 2019-12-16 DIAGNOSIS — E785 Hyperlipidemia, unspecified: Secondary | ICD-10-CM

## 2019-12-16 DIAGNOSIS — M79605 Pain in left leg: Secondary | ICD-10-CM

## 2019-12-16 DIAGNOSIS — M7989 Other specified soft tissue disorders: Secondary | ICD-10-CM | POA: Diagnosis not present

## 2019-12-16 DIAGNOSIS — I10 Essential (primary) hypertension: Secondary | ICD-10-CM

## 2019-12-16 DIAGNOSIS — I480 Paroxysmal atrial fibrillation: Secondary | ICD-10-CM

## 2019-12-16 DIAGNOSIS — M79609 Pain in unspecified limb: Secondary | ICD-10-CM | POA: Insufficient documentation

## 2019-12-16 NOTE — Progress Notes (Signed)
Patient ID: Jodi Stein, female   DOB: 30-Oct-1940, 79 y.o.   MRN: [id number]  Chief Complaint  Patient presents with  . New Patient (Initial Visit)    BLE Edema    HPI [name] is a 79 y.o. female.  I am asked to see the patient by Dr. Clayborn Bigness for evaluation of leg swelling and pain.  Over the past few months, she has noticed worsening swelling and discomfort in her legs.  This is predominantly on the left.  Over the past few days, she says her pain is actually better but she does still have swelling in her legs.  No antecedent history of DVT or superficial thrombophlebitis to her knowledge.  No clear cause or inciting event that started the symptoms.  Elevating them more has helped.  She also describes some issues with her back for which she is being evaluated for potential back surgery..     Past Medical History:  Diagnosis Date  . A-fib ([place])   . Abdominal pain 2013  . Arthritis   . Bladder wall thickening   . Constipation   . Diverticulosis   . Family history of adverse reaction to anesthesia   . GERD (gastroesophageal reflux disease)   . Hyperlipidemia   . Hypertension   . Neuroendocrine tumor    rectal  . Obesity (BMI 30-39.9)   . Steatosis of liver     Past Surgical History:  Procedure Laterality Date  . ABDOMINAL HYSTERECTOMY    . CHOLECYSTECTOMY    . COLONOSCOPY WITH PROPOFOL N/A 02/08/2016   Procedure: COLONOSCOPY WITH PROPOFOL;  Surgeon: Lollie Sails, MD;  Location: [facility] ENDOSCOPY;  Service: Endoscopy;  Laterality: N/A;  . EUS N/A 08/24/2016   Procedure: LOWER ENDOSCOPIC ULTRASOUND (EUS);  Surgeon: Milus Banister, MD;  Location: [facility] ENDOSCOPY;  Service: Endoscopy;  Laterality: N/A;  . EYE SURGERY    . FLEXIBLE SIGMOIDOSCOPY N/A 08/08/2016   Procedure: FLEXIBLE SIGMOIDOSCOPY;  Surgeon: Lollie Sails, MD;  Location: [facility] ENDOSCOPY;  Service: Endoscopy;  Laterality: N/A;  . FLEXIBLE SIGMOIDOSCOPY N/A 04/04/2018   Procedure: FLEXIBLE  SIGMOIDOSCOPY;  Surgeon: Lollie Sails, MD;  Location: [facility] ENDOSCOPY;  Service: Endoscopy;  Laterality: N/A;  . HERNIA REPAIR  0000000   umbilical  . HERNIA REPAIR  09/27/2012   ventral hernia  . ROTATOR CUFF REPAIR Left      Family History  Problem Relation Age of Onset  . Breast cancer Neg Hx   No bleeding disorders, clotting disorders, or autoimmune diseases   Social History   Tobacco Use  . Smoking status: Former Smoker    Packs/day: 0.25    Years: 2.00    Pack years: 0.50    Types: Cigarettes    Quit date: 07/24/1961    Years since quitting: 58.4  . Smokeless tobacco: Never Used  Substance Use Topics  . Alcohol use: No  . Drug use: No     Allergies  Allergen Reactions  . Codeine Nausea And Vomiting  . Tramadol Nausea And Vomiting    Current Outpatient Medications  Medication Sig Dispense Refill  . apixaban (ELIQUIS) 5 MG TABS tablet Take by mouth.    Marland Kitchen atorvastatin (LIPITOR) 10 MG tablet Take 10 mg by mouth daily.    Marland Kitchen azithromycin (ZITHROMAX) 250 MG tablet     . Cholecalciferol 25 MCG (1000 UT) tablet Take by mouth.    . diclofenac Sodium (VOLTAREN) 1 % GEL Apply topically.    . furosemide (  LASIX) 20 MG tablet Take by mouth.    . gabapentin (NEURONTIN) 100 MG capsule     . lisinopril (PRINIVIL,ZESTRIL) 20 MG tablet Take 20 mg by mouth daily.    . metoprolol tartrate (LOPRESSOR) 100 MG tablet Take by mouth.    . naproxen (EC NAPROSYN) 500 MG EC tablet Take 1 tablet (500 mg total) by mouth 2 (two) times daily with a meal. 30 tablet 0  . omeprazole (PRILOSEC) 20 MG capsule Take 20 mg by mouth daily.    . polyethylene glycol (MIRALAX / GLYCOLAX) packet Take 17 g by mouth daily. Mix in 4-8oz fluid    . simvastatin (ZOCOR) 40 MG tablet Take 40 mg by mouth daily.    Marland Kitchen lactulose (CHRONULAC) 10 GM/15ML solution Take 20 g by mouth daily as needed for mild constipation.     . lidocaine (LIDODERM) 5 % Place 1 patch onto the skin daily. Remove & Discard patch within 12  hours or as directed by MD (Patient not taking: Reported on 12/16/2019) 30 patch 0   No current facility-administered medications for this visit.      REVIEW OF SYSTEMS (Negative unless checked)  Constitutional: [] Weight loss  [] Fever  [] Chills Cardiac: [] Chest pain   [] Chest pressure   [] Palpitations   [] Shortness of breath when laying flat   [] Shortness of breath at rest   [] Shortness of breath with exertion. Vascular:  [x] Pain in legs with walking   [x] Pain in legs at rest   [] Pain in legs when laying flat   [] Claudication   [] Pain in feet when walking  [] Pain in feet at rest  [] Pain in feet when laying flat   [] History of DVT   [] Phlebitis   [x] Swelling in legs   [] Varicose veins   [] Non-healing ulcers Pulmonary:   [] Uses home oxygen   [] Productive cough   [] Hemoptysis   [] Wheeze  [] COPD   [] Asthma Neurologic:  [] Dizziness  [] Blackouts   [] Seizures   [] History of stroke   [] History of TIA  [] Aphasia   [] Temporary blindness   [] Dysphagia   [] Weakness or numbness in arms   [] Weakness or numbness in legs Musculoskeletal:  [x] Arthritis   [] Joint swelling   [] Joint pain   [x] Low back pain Hematologic:  [] Easy bruising  [] Easy bleeding   [] Hypercoagulable state   [] Anemic  [] Hepatitis Gastrointestinal:  [] Blood in stool   [] Vomiting blood  [] Gastroesophageal reflux/heartburn   [] Abdominal pain Genitourinary:  [] Chronic kidney disease   [] Difficult urination  [] Frequent urination  [] Burning with urination   [] Hematuria Skin:  [] Rashes   [] Ulcers   [] Wounds Psychological:  [] History of anxiety   []  History of major depression.    Physical Exam BP 119/65   Pulse 87   Ht 5\' 2"  (1.575 m)   Wt 194 lb (88 kg)   BMI 35.48 kg/m  Gen:  WD/WN, NAD.  Appears younger than stated age  Head: [place]/AT, No temporalis wasting.  Ear/Nose/Throat: Hearing grossly intact, nares w/o erythema or drainage, oropharynx w/o Erythema/Exudate Eyes: Conjunctiva clear, sclera non-icteric  Neck: trachea midline.  No JVD.   Pulmonary:  Good air movement, respirations not labored, no use of accessory muscles  Cardiac: RRR, no JVD Vascular:  Vessel Right Left  Radial Palpable Palpable                          DP  2+  1+  PT  2+  1+   Gastrointestinal:. No masses, surgical incisions,  or scars. Musculoskeletal: M/S 5/5 throughout.  Extremities without ischemic changes.  No deformity or atrophy.  Trace right lower extremity edema, 1+ left lower extremity edema. Neurologic: Sensation grossly intact in extremities.  Symmetrical.  Speech is fluent. Motor exam as listed above. Psychiatric: Judgment intact, Mood & affect appropriate for pt's clinical situation. Dermatologic: No rashes or ulcers noted.  No cellulitis or open wounds.    Radiology MR CERVICAL SPINE WO CONTRAST  Result Date: 12/08/2019 CLINICAL DATA:  Initial evaluation for left lower extremity pain and numbness for 2 months. EXAM: MRI CERVICAL SPINE WITHOUT CONTRAST TECHNIQUE: Multiplanar, multisequence MR imaging of the cervical spine was performed. No intravenous contrast was administered. COMPARISON:  None available. FINDINGS: Alignment: Straightening of the normal cervical lordosis. Trace anterolisthesis of C7 on T1 and T1 on T2, chronic and facet mediated. Vertebrae: Vertebral body height maintained without evidence for acute or chronic fracture. Bone marrow signal intensity within normal limits. No discrete or worrisome osseous lesions. No abnormal marrow edema. Cord: Signal intensity within the cervical spinal cord is normal. Posterior Fossa, vertebral arteries, paraspinal tissues: Visualized brain and posterior fossa within normal limits. Craniocervical junction normal. Paraspinous and prevertebral soft tissues within normal limits. Normal intravascular flow voids seen within the vertebral arteries bilaterally. Disc levels: C2-C3: Negative interspace. Right-sided facet hypertrophy with associated ankylosis. No significant canal or foraminal  stenosis. C3-C4: Chronic intervertebral disc space narrowing with diffuse degenerative disc osteophyte, asymmetric to the right. Broad posterior component flattens and effaces the ventral thecal sac, eccentric to the right. Secondary moderate spinal stenosis with mild cord flattening. No cord signal changes. Moderate to severe bilateral C4 foraminal stenosis. C4-C5: Minimal annular disc bulge with uncovertebral hypertrophy, slightly greater on the right. Superimposed bilateral facet degeneration. No significant spinal stenosis. Moderate right worse than left C5 foraminal stenosis. C5-C6: Mild disc bulge with uncovertebral hypertrophy. Superimposed bilateral facet degeneration. No significant spinal stenosis. Moderate bilateral C6 foraminal narrowing. C6-C7: Mild disc bulge with uncovertebral spurring. Bilateral facet hypertrophy. No significant spinal stenosis. Mild to moderate bilateral C7 foraminal stenosis. C7-T1: Trace anterolisthesis. No significant disc bulge. Bilateral facet hypertrophy. No canal or foraminal stenosis. Visualized upper thoracic spine demonstrates no significant finding. IMPRESSION: 1. 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 2. Moderate bilateral C5 through C7 foraminal stenosis related to uncovertebral and facet hypertrophy. Electronically Signed   By: Jeannine Boga M.D.   On: 12/08/2019 20:35    Labs No results found for this or any previous visit (from the past 2160 hour(s)).  Assessment/Plan:  Paroxysmal A-fib (HCC) Rate controlled and on anticoagulation  Hypertension blood pressure control important in reducing the progression of atherosclerotic disease. On appropriate oral medications.   Hyperlipidemia lipid control important in reducing the progression of atherosclerotic disease. Continue statin therapy   Swelling of  limb Recommend the daily use compression stockings with leg elevation and increasing activity.  Arterial and venous work-up as planned below.  Pain in limb  Recommend:  The patient has atypical pain symptoms for pure atherosclerotic disease. However, on physical exam there is evidence of mixed venous and arterial disease, given the diminished pulses and the edema associated with venous changes of the legs.  Noninvasive studies including ABI's and venous ultrasound of the legs will be obtained and the patient will follow up with me to review these studies.  I suspect the patient is c/o pseudoclaudication.  Patient should have an  evaluation of his LS spine which I defer to the primary service.  The patient should continue walking and begin a more formal exercise program. The patient should continue his antiplatelet therapy and aggressive treatment of the lipid abnormalities.  The patient should begin wearing graduated compression socks 15-20 mmHg strength to control edema.       Leotis Pain 12/16/2019, 2:02 PM   This note was created with Dragon medical transcription system.  Any errors from dictation are unintentional.

## 2019-12-16 NOTE — Assessment & Plan Note (Signed)
lipid control important in reducing the progression of atherosclerotic disease. Continue statin therapy  

## 2019-12-16 NOTE — Assessment & Plan Note (Signed)
Rate controlled and on anticoagulation. 

## 2019-12-16 NOTE — Assessment & Plan Note (Signed)
Recommend the daily use compression stockings with leg elevation and increasing activity.  Arterial and venous work-up as planned below.

## 2019-12-16 NOTE — Assessment & Plan Note (Signed)
Recommend:  The patient has atypical pain symptoms for pure atherosclerotic disease. However, on physical exam there is evidence of mixed venous and arterial disease, given the diminished pulses and the edema associated with venous changes of the legs.  Noninvasive studies including ABI's and venous ultrasound of the legs will be obtained and the patient will follow up with me to review these studies.  I suspect the patient is c/o pseudoclaudication.  Patient should have an evaluation of his LS spine which I defer to the primary service.  The patient should continue walking and begin a more formal exercise program. The patient should continue his antiplatelet therapy and aggressive treatment of the lipid abnormalities.  The patient should begin wearing graduated compression socks 15-20 mmHg strength to control edema.  

## 2019-12-16 NOTE — Assessment & Plan Note (Signed)
blood pressure control important in reducing the progression of atherosclerotic disease. On appropriate oral medications.  

## 2020-01-02 ENCOUNTER — Ambulatory Visit (INDEPENDENT_AMBULATORY_CARE_PROVIDER_SITE_OTHER): Payer: Medicare Other | Admitting: Nurse Practitioner

## 2020-01-02 ENCOUNTER — Other Ambulatory Visit: Payer: Self-pay

## 2020-01-02 ENCOUNTER — Ambulatory Visit (INDEPENDENT_AMBULATORY_CARE_PROVIDER_SITE_OTHER): Payer: Medicare Other

## 2020-01-02 ENCOUNTER — Encounter (INDEPENDENT_AMBULATORY_CARE_PROVIDER_SITE_OTHER): Payer: Self-pay | Admitting: Nurse Practitioner

## 2020-01-02 VITALS — BP 119/85 | HR 80 | Ht 62.0 in | Wt 194.0 lb

## 2020-01-02 DIAGNOSIS — I10 Essential (primary) hypertension: Secondary | ICD-10-CM | POA: Diagnosis not present

## 2020-01-02 DIAGNOSIS — M79605 Pain in left leg: Secondary | ICD-10-CM | POA: Diagnosis not present

## 2020-01-02 DIAGNOSIS — M7989 Other specified soft tissue disorders: Secondary | ICD-10-CM

## 2020-01-02 DIAGNOSIS — R600 Localized edema: Secondary | ICD-10-CM

## 2020-01-05 ENCOUNTER — Encounter (INDEPENDENT_AMBULATORY_CARE_PROVIDER_SITE_OTHER): Payer: Self-pay | Admitting: Nurse Practitioner

## 2020-01-05 NOTE — Progress Notes (Signed)
Subjective:    Patient ID: Jodi Stein, female    DOB: January 11, 1941, 79 y.o.   MRN: [id number] Chief Complaint  Patient presents with  . Follow-up    U/S Follow up    Jodi Stein is a 79 y.o. female.  The patient returns today for evaluation of lower extremity edema and pain.  The patient noticed about 3 to 4 months ago she began to have swelling in her bilateral lower extremities however the left lower extremity was consistently worse than the right.  The patient notes that about a week ago the swelling went down with continued elevation.  The patient also notes that the pain has actually lessened as well.  The patient has also recently been diagnosed with spinal stenosis, and notes that she will be following up with neurosurgery soon.  The patient has no history of DVT or superficial thrombophlebitis.  When the swelling initially occurred, the patient had a DVT study which was negative.  There is no clear cause or inciting events.  The patient does have a known history of atrial fibrillation for which she is anticoagulated.  Today noninvasive studies reveal an ABI of 1.12 bilaterally, with triphasic tibial artery waveforms bilaterally with good toe waveforms bilaterally.  The patient also underwent a bilateral lower extremity venous reflux study which showed no evidence of DVT or superficial venous thrombosis bilaterally.  No evidence of deep venous insufficiency seen bilaterally.  No evidence of superficial venous reflux seen in the bilateral great saphenous or small saphenous veins.   Review of Systems  Cardiovascular: Positive for leg swelling.  Musculoskeletal: Positive for back pain.  All other systems reviewed and are negative.      Objective:   Physical Exam Vitals reviewed.  HENT:     Head: Normocephalic.  Cardiovascular:     Rate and Rhythm: Normal rate. Rhythm irregular.     Pulses: Normal pulses.     Heart sounds: Normal heart sounds.  Pulmonary:     Effort:  Pulmonary effort is normal.     Breath sounds: Normal breath sounds.  Musculoskeletal:     Right lower leg: Edema present.     Left lower leg: Edema present.  Neurological:     Mental Status: She is alert and oriented to person, place, and time.  Psychiatric:        Mood and Affect: Mood normal.        Behavior: Behavior normal.        Thought Content: Thought content normal.        Judgment: Judgment normal.     BP 119/85   Pulse 80   Ht 5\' 2"  (1.575 m)   Wt 194 lb (88 kg)   BMI 35.48 kg/m   Past Medical History:  Diagnosis Date  . A-fib ([place])   . Abdominal pain 2013  . Arthritis   . Bladder wall thickening   . Constipation   . Diverticulosis   . Family history of adverse reaction to anesthesia   . GERD (gastroesophageal reflux disease)   . Hyperlipidemia   . Hypertension   . Neuroendocrine tumor    rectal  . Obesity (BMI 30-39.9)   . Steatosis of liver     Social History   Socioeconomic History  . Marital status: Widowed    Spouse name: Not on file  . Number of children: Not on file  . Years of education: Not on file  . Highest education level: Not on file  Occupational History  . Not on file  Tobacco Use  . Smoking status: Former Smoker    Packs/day: 0.25    Years: 2.00    Pack years: 0.50    Types: Cigarettes    Quit date: 07/24/1961    Years since quitting: 58.4  . Smokeless tobacco: Never Used  Vaping Use  . Vaping Use: Never used  Substance and Sexual Activity  . Alcohol use: No  . Drug use: No  . Sexual activity: Not on file  Other Topics Concern  . Not on file  Social History Narrative  . Not on file   Social Determinants of Health   Financial Resource Strain:   . Difficulty of Paying Living Expenses:   Food Insecurity:   . Worried About Charity fundraiser in the Last Year:   . Arboriculturist in the Last Year:   Transportation Needs:   . Film/video editor (Medical):   Marland Kitchen Lack of Transportation (Non-Medical):   Physical  Activity:   . Days of Exercise per Week:   . Minutes of Exercise per Session:   Stress:   . Feeling of Stress :   Social Connections:   . Frequency of Communication with Friends and Family:   . Frequency of Social Gatherings with Friends and Family:   . Attends Religious Services:   . Active Member of Clubs or Organizations:   . Attends Archivist Meetings:   Marland Kitchen Marital Status:   Intimate Partner Violence:   . Fear of Current or Ex-Partner:   . Emotionally Abused:   Marland Kitchen Physically Abused:   . Sexually Abused:     Past Surgical History:  Procedure Laterality Date  . ABDOMINAL HYSTERECTOMY    . CHOLECYSTECTOMY    . COLONOSCOPY WITH PROPOFOL N/A 02/08/2016   Procedure: COLONOSCOPY WITH PROPOFOL;  Surgeon: Lollie Sails, MD;  Location: [facility] ENDOSCOPY;  Service: Endoscopy;  Laterality: N/A;  . EUS N/A 08/24/2016   Procedure: LOWER ENDOSCOPIC ULTRASOUND (EUS);  Surgeon: Milus Banister, MD;  Location: [facility] ENDOSCOPY;  Service: Endoscopy;  Laterality: N/A;  . EYE SURGERY    . FLEXIBLE SIGMOIDOSCOPY N/A 08/08/2016   Procedure: FLEXIBLE SIGMOIDOSCOPY;  Surgeon: Lollie Sails, MD;  Location: [facility] ENDOSCOPY;  Service: Endoscopy;  Laterality: N/A;  . FLEXIBLE SIGMOIDOSCOPY N/A 04/04/2018   Procedure: FLEXIBLE SIGMOIDOSCOPY;  Surgeon: Lollie Sails, MD;  Location: [facility] ENDOSCOPY;  Service: Endoscopy;  Laterality: N/A;  . HERNIA REPAIR  1962   umbilical  . HERNIA REPAIR  09/27/2012   ventral hernia  . ROTATOR CUFF REPAIR Left     Family History  Problem Relation Age of Onset  . Breast cancer Neg Hx     Allergies  Allergen Reactions  . Codeine Nausea And Vomiting  . Tramadol Nausea And Vomiting       Assessment & Plan:   1. Lower extremity edema No surgery or intervention at this point in time.  I have reviewed my discussion with the patient regarding venous insufficiency and why it causes symptoms. I have discussed with the patient the chronic skin changes that  accompany venous insufficiency and the long term sequela such as ulceration. Patient will contnue wearing graduated compression stockings on a daily basis, as this has provided excellent control of his edema. The patient will put the stockings on first thing in the morning and removing them in the evening. The patient is reminded not to sleep in the stockings.  In addition, behavioral  modification including elevation during the day will be initiated. Exercise is strongly encouraged.  Given the patient's good control and lack of any problems a lymph pump in not need at this time.  The patient will follow up with me PRN should anything change.  The patient voices agreement with this plan.   2. Pain of left lower extremity Recommend:  I do not find evidence of Vascular pathology that would explain the patient's symptoms  The patient has atypical pain symptoms for vascular disease  I do not find evidence of Vascular pathology that would explain the patient's symptoms and I suspect the patient is c/o pseudoclaudication.  The patient does have known spinal stenosis that she will be following up with neurosurgery at [facility]   Noninvasive studies including venous ultrasound of the legs do not identify vascular problems  The patient should continue walking and begin a more formal exercise program. The patient should continue his antiplatelet therapy and aggressive treatment of the lipid abnormalities. The patient should begin wearing graduated compression socks 15-20 mmHg strength to control her mild edema.  Patient will follow-up with me on a PRN basis  Further work-up of her lower extremity pain is deferred to the primary service     3. Essential hypertension Continue antihypertensive medications as already ordered, these medications have been reviewed and there are no changes at this time.    Current Outpatient Medications on File Prior to Visit  Medication Sig Dispense Refill    . apixaban (ELIQUIS) 5 MG TABS tablet Take by mouth.    Marland Kitchen atorvastatin (LIPITOR) 10 MG tablet Take 10 mg by mouth daily.    Marland Kitchen atorvastatin (LIPITOR) 40 MG tablet     . azithromycin (ZITHROMAX) 250 MG tablet     . Cholecalciferol 25 MCG (1000 UT) tablet Take by mouth.    . diclofenac Sodium (VOLTAREN) 1 % GEL Apply topically.    . furosemide (LASIX) 20 MG tablet Take by mouth.    . gabapentin (NEURONTIN) 100 MG capsule     . lactulose (CHRONULAC) 10 GM/15ML solution Take 20 g by mouth daily as needed for mild constipation.     . lidocaine (LIDODERM) 5 % Place 1 patch onto the skin daily. Remove & Discard patch within 12 hours or as directed by MD 30 patch 0  . lisinopril (PRINIVIL,ZESTRIL) 20 MG tablet Take 20 mg by mouth daily.    . metoprolol tartrate (LOPRESSOR) 100 MG tablet Take by mouth.    . naproxen (EC NAPROSYN) 500 MG EC tablet Take 1 tablet (500 mg total) by mouth 2 (two) times daily with a meal. 30 tablet 0  . omeprazole (PRILOSEC) 20 MG capsule Take 20 mg by mouth daily.    . polyethylene glycol (MIRALAX / GLYCOLAX) packet Take 17 g by mouth daily. Mix in 4-8oz fluid    . simvastatin (ZOCOR) 40 MG tablet Take 40 mg by mouth daily.     No current facility-administered medications on file prior to visit.    There are no Patient Instructions on file for this visit. No follow-ups on file.   Kris Hartmann, NP

## 2020-02-02 ENCOUNTER — Other Ambulatory Visit: Payer: Self-pay | Admitting: Orthopedic Surgery

## 2020-02-02 ENCOUNTER — Other Ambulatory Visit: Payer: Self-pay

## 2020-02-02 ENCOUNTER — Other Ambulatory Visit: Payer: Self-pay | Admitting: Radiation Oncology

## 2020-02-02 DIAGNOSIS — M4317 Spondylolisthesis, lumbosacral region: Secondary | ICD-10-CM

## 2020-02-02 DIAGNOSIS — M48061 Spinal stenosis, lumbar region without neurogenic claudication: Secondary | ICD-10-CM

## 2020-03-02 ENCOUNTER — Other Ambulatory Visit: Payer: Self-pay

## 2020-03-02 ENCOUNTER — Ambulatory Visit
Admission: RE | Admit: 2020-03-02 | Discharge: 2020-03-02 | Disposition: A | Discharge status: Home / Self Care | Payer: Medicare Other | Source: Ambulatory Visit | Attending: Orthopedic Surgery | Admitting: Orthopedic Surgery

## 2020-03-02 DIAGNOSIS — M4317 Spondylolisthesis, lumbosacral region: Secondary | ICD-10-CM

## 2020-03-02 DIAGNOSIS — M48061 Spinal stenosis, lumbar region without neurogenic claudication: Secondary | ICD-10-CM

## 2020-09-16 ENCOUNTER — Other Ambulatory Visit: Payer: Self-pay

## 2020-09-16 ENCOUNTER — Other Ambulatory Visit
Admission: RE | Admit: 2020-09-16 | Discharge: 2020-09-16 | Disposition: A | Discharge status: Home / Self Care | Payer: Medicare Other | Source: Ambulatory Visit | Attending: Internal Medicine | Admitting: Internal Medicine

## 2020-09-16 DIAGNOSIS — Z20822 Contact with and (suspected) exposure to covid-19: Secondary | ICD-10-CM | POA: Insufficient documentation

## 2020-09-16 DIAGNOSIS — Z01812 Encounter for preprocedural laboratory examination: Secondary | ICD-10-CM | POA: Diagnosis present

## 2020-09-16 LAB — SARS CORONAVIRUS 2 (TAT 6-24 HRS): SARS Coronavirus 2: NEGATIVE

## 2020-09-20 ENCOUNTER — Encounter: Payer: Self-pay | Admitting: Internal Medicine

## 2020-09-20 ENCOUNTER — Ambulatory Visit: Payer: Medicare Other | Admitting: Anesthesiology

## 2020-09-20 ENCOUNTER — Encounter
Admission: RE | Disposition: A | Discharge status: Home / Self Care | Payer: Self-pay | Source: Home / Self Care | Attending: Internal Medicine

## 2020-09-20 ENCOUNTER — Ambulatory Visit
Admission: RE | Admit: 2020-09-20 | Discharge: 2020-09-20 | Disposition: A | Discharge status: Home / Self Care | Payer: Medicare Other | Attending: Internal Medicine | Admitting: Internal Medicine

## 2020-09-20 DIAGNOSIS — Z791 Long term (current) use of non-steroidal anti-inflammatories (NSAID): Secondary | ICD-10-CM | POA: Insufficient documentation

## 2020-09-20 DIAGNOSIS — K219 Gastro-esophageal reflux disease without esophagitis: Secondary | ICD-10-CM | POA: Insufficient documentation

## 2020-09-20 DIAGNOSIS — K5900 Constipation, unspecified: Secondary | ICD-10-CM | POA: Diagnosis not present

## 2020-09-20 DIAGNOSIS — D125 Benign neoplasm of sigmoid colon: Secondary | ICD-10-CM | POA: Insufficient documentation

## 2020-09-20 DIAGNOSIS — K640 First degree hemorrhoids: Secondary | ICD-10-CM | POA: Diagnosis not present

## 2020-09-20 DIAGNOSIS — K449 Diaphragmatic hernia without obstruction or gangrene: Secondary | ICD-10-CM | POA: Insufficient documentation

## 2020-09-20 DIAGNOSIS — K2100 Gastro-esophageal reflux disease with esophagitis, without bleeding: Secondary | ICD-10-CM | POA: Insufficient documentation

## 2020-09-20 DIAGNOSIS — I4891 Unspecified atrial fibrillation: Secondary | ICD-10-CM | POA: Insufficient documentation

## 2020-09-20 DIAGNOSIS — Z888 Allergy status to other drugs, medicaments and biological substances status: Secondary | ICD-10-CM | POA: Insufficient documentation

## 2020-09-20 DIAGNOSIS — K5730 Diverticulosis of large intestine without perforation or abscess without bleeding: Secondary | ICD-10-CM | POA: Insufficient documentation

## 2020-09-20 DIAGNOSIS — Z79899 Other long term (current) drug therapy: Secondary | ICD-10-CM | POA: Insufficient documentation

## 2020-09-20 DIAGNOSIS — K222 Esophageal obstruction: Secondary | ICD-10-CM | POA: Diagnosis not present

## 2020-09-20 DIAGNOSIS — Z885 Allergy status to narcotic agent status: Secondary | ICD-10-CM | POA: Diagnosis not present

## 2020-09-20 DIAGNOSIS — Z7901 Long term (current) use of anticoagulants: Secondary | ICD-10-CM | POA: Insufficient documentation

## 2020-09-20 DIAGNOSIS — D122 Benign neoplasm of ascending colon: Secondary | ICD-10-CM | POA: Diagnosis not present

## 2020-09-20 HISTORY — PX: COLONOSCOPY: SHX5424

## 2020-09-20 HISTORY — PX: ESOPHAGOGASTRODUODENOSCOPY (EGD) WITH PROPOFOL: SHX5813

## 2020-09-20 SURGERY — COLONOSCOPY
Anesthesia: General

## 2020-09-20 MED ORDER — PROPOFOL 500 MG/50ML IV EMUL
INTRAVENOUS | Status: AC
  Filled 2020-09-20: qty 50

## 2020-09-20 MED ORDER — PROPOFOL 10 MG/ML IV BOLUS
INTRAVENOUS | Status: DC | PRN
  Administered 2020-09-20: 50 mg via INTRAVENOUS

## 2020-09-20 MED ORDER — SODIUM CHLORIDE 0.9 % IV SOLN: INTRAVENOUS | Status: DC

## 2020-09-20 MED ORDER — PROPOFOL 500 MG/50ML IV EMUL
INTRAVENOUS | Status: DC | PRN
  Administered 2020-09-20: 200 ug/kg/min via INTRAVENOUS

## 2020-09-20 MED ORDER — LIDOCAINE HCL (PF) 2 % IJ SOLN
INTRAMUSCULAR | Status: DC | PRN
  Administered 2020-09-20: 60 mg via INTRADERMAL

## 2020-09-20 NOTE — Anesthesia Procedure Notes (Signed)
Date/Time: 09/20/2020 9:00 AM Performed by: Nelda Marseille, CRNA Pre-anesthesia Checklist: Patient identified, Emergency Drugs available, Suction available, Patient being monitored and Timeout performed Oxygen Delivery Method: Nasal cannula

## 2020-09-20 NOTE — H&P (Signed)
Outpatient short stay form Pre-procedure 09/20/2020 8:36 AM Teodoro K. Alice Reichert, M.D.  Primary Physician: Harrel Lemon, M.D.  Reason for visit:  Change in bowel habits, GERD  History of present illness:  Patient with history of NET of the rectum c/o intermiitent worsening constipation. GERD is stable on current medications without dysphagia, melena, abdominal pain, or weight loss.    Current Facility-Administered Medications:  .  0.9 %  sodium chloride infusion, , Intravenous, Continuous, [place], Teodoro K, MD .  0.9 %  sodium chloride infusion, , Intravenous, Continuous, [place], Benay Pike, MD, Last Rate: 20 mL/hr at 09/20/20 0737, New Bag at 09/20/20 0737  Medications Prior to Admission  Medication Sig Dispense Refill Last Dose  . atorvastatin (LIPITOR) 10 MG tablet Take 10 mg by mouth daily.   09/19/2020 at Unknown time  . Cholecalciferol 25 MCG (1000 UT) tablet Take by mouth.   09/19/2020 at Unknown time  . furosemide (LASIX) 20 MG tablet Take by mouth.   09/19/2020 at Unknown time  . gabapentin (NEURONTIN) 100 MG capsule    09/19/2020 at Unknown time  . lisinopril (PRINIVIL,ZESTRIL) 20 MG tablet Take 20 mg by mouth daily.   09/20/2020 at Unknown time  . metoprolol tartrate (LOPRESSOR) 100 MG tablet Take by mouth.   09/20/2020 at Unknown time  . omeprazole (PRILOSEC) 20 MG capsule Take 20 mg by mouth daily.   09/19/2020 at Unknown time  . apixaban (ELIQUIS) 5 MG TABS tablet Take by mouth.   09/16/2020  . atorvastatin (LIPITOR) 40 MG tablet      . diclofenac Sodium (VOLTAREN) 1 % GEL Apply topically.     . lactulose (CHRONULAC) 10 GM/15ML solution Take 20 g by mouth daily as needed for mild constipation.  (Patient not taking: Reported on 09/20/2020)   Not Taking at Unknown time  . lidocaine (LIDODERM) 5 % Place 1 patch onto the skin daily. Remove & Discard patch within 12 hours or as directed by MD 30 patch 0   . naproxen (EC NAPROSYN) 500 MG EC tablet Take 1 tablet (500 mg total) by mouth 2  (two) times daily with a meal. 30 tablet 0   . polyethylene glycol (MIRALAX / GLYCOLAX) packet Take 17 g by mouth daily. Mix in 4-8oz fluid        Allergies  Allergen Reactions  . Codeine Nausea And Vomiting  . Tramadol Nausea And Vomiting     Past Medical History:  Diagnosis Date  . A-fib ([place])   . Abdominal pain 2013  . Arthritis   . Bladder wall thickening   . Constipation   . Diverticulosis   . Family history of adverse reaction to anesthesia   . GERD (gastroesophageal reflux disease)   . Hyperlipidemia   . Hypertension   . Neuroendocrine tumor    rectal  . Obesity (BMI 30-39.9)   . Steatosis of liver     Review of systems:  Otherwise negative.    Physical Exam  Gen: Alert, oriented. Appears stated age.  HEENT: [place]/AT. PERRLA. Lungs: CTA, no wheezes. CV: RR nl S1, S2. Abd: soft, benign, no masses. BS+ Ext: No edema. Pulses 2+    Planned procedures: Proceed with EGD and colonoscopy. The patient understands the nature of the planned procedure, indications, risks, alternatives and potential complications including but not limited to bleeding, infection, perforation, damage to internal organs and possible oversedation/side effects from anesthesia. The patient agrees and gives consent to proceed.  Please refer to procedure notes for findings, recommendations and  patient disposition/instructions.     Teodoro K. Alice Reichert, M.D. Gastroenterology 09/20/2020  8:36 AM

## 2020-09-20 NOTE — Op Note (Signed)
[facility] Gastroenterology Patient Name: Jodi Stein Procedure Date: 09/20/2020 7:57 AM MRN: [id number] Account #: [id number] Date of Birth: Jul 11, 1941 Admit Type: Outpatient Age: 80 Room: [facility] ENDO ROOM 2 Gender: Female Note Status: Finalized Procedure:             Colonoscopy Indications:           Change in bowel habits Providers:             Benay Pike. Alice Reichert MD, MD Referring MD:          Baxter Hire, MD (Referring MD) Medicines:             Propofol per Anesthesia Complications:         No immediate complications. Procedure:             Pre-Anesthesia Assessment:                        - The risks and benefits of the procedure and the                         sedation options and risks were discussed with the                         patient. All questions were answered and informed                         consent was obtained.                        - Patient identification and proposed procedure were                         verified prior to the procedure by the nurse. The                         procedure was verified in the procedure room.                        - ASA Grade Assessment: III - A patient with severe                         systemic disease.                        - After reviewing the risks and benefits, the patient                         was deemed in satisfactory condition to undergo the                         procedure.                        After obtaining informed consent, the colonoscope was                         passed under direct vision. Throughout the procedure,                         the patient's blood pressure, pulse,  and oxygen                         saturations were monitored continuously. The                         Colonoscope was introduced through the anus and                         advanced to the the cecum, identified by appendiceal                         orifice and ileocecal valve. The colonoscopy was                          performed without difficulty. The patient tolerated                         the procedure well. The quality of the bowel                         preparation was good. The ileocecal valve, appendiceal                         orifice, and rectum were photographed. Findings:      The perianal and digital rectal examinations were normal. Pertinent       negatives include normal sphincter tone and no palpable rectal lesions.      Non-bleeding internal hemorrhoids were found during retroflexion. The       hemorrhoids were Grade I (internal hemorrhoids that do not prolapse).      Many small and large-mouthed diverticula were found in the left colon.      Two sessile polyps were found in the sigmoid colon and ascending colon.       The polyps were 4 to 5 mm in size. These polyps were removed with a       jumbo cold forceps. Resection and retrieval were complete.      The exam was otherwise without abnormality. Impression:            - Non-bleeding internal hemorrhoids.                        - Diverticulosis in the left colon.                        - Two 4 to 5 mm polyps in the sigmoid colon and in the                         ascending colon, removed with a jumbo cold forceps.                         Resected and retrieved.                        - The examination was otherwise normal. Recommendation:        - Patient has a contact number available for                         emergencies. The signs  and symptoms of potential                         delayed complications were discussed with the patient.                         Return to normal activities tomorrow. Written                         discharge instructions were provided to the patient.                        - Resume previous diet.                        - Continue present medications.                        - If polyps are benign or adenomatous without                         dysplasia, I will advise NO  further colonoscopy due to                         advanced age and/or severe comorbidity.                        - You do NOT require further colon cancer screening                         measures (Annual stool testing (i.e. hemoccult, FIT,                         cologuard), sigmoidoscopy, colonoscopy or CT                         colonography). You should share this recommendation                         with your Primary Care provider.                        - Return to GI office PRN.                        - The findings and recommendations were discussed with                         the patient. Procedure Code(s):     --- Professional ---                        [phone], Colonoscopy, flexible; with biopsy, single or                         multiple Diagnosis Code(s):     --- Professional ---                        K57.30, Diverticulosis of large intestine without  perforation or abscess without bleeding                        R19.4, Change in bowel habit                        K63.5, Polyp of colon                        K64.0, First degree hemorrhoids CPT copyright 2019 American Medical Association. All rights reserved. The codes documented in this report are preliminary and upon coder review may  be revised to meet current compliance requirements. Efrain Sella MD, MD 09/20/2020 9:16:35 AM This report has been signed electronically. Number of Addenda: 0 Note Initiated On: 09/20/2020 7:57 AM Scope Withdrawal Time: 0 hours 5 minutes 35 seconds  Total Procedure Duration: 0 hours 10 minutes 50 seconds  Estimated Blood Loss:  Estimated blood loss: none.      [facility]

## 2020-09-20 NOTE — Anesthesia Postprocedure Evaluation (Signed)
Anesthesia Post Note  Patient: Jodi Stein  Procedure(s) Performed: COLONOSCOPY (N/A ) ESOPHAGOGASTRODUODENOSCOPY (EGD) WITH PROPOFOL (N/A )  Patient location during evaluation: Endoscopy Anesthesia Type: General Level of consciousness: awake and alert Pain management: pain level controlled Vital Signs Assessment: post-procedure vital signs reviewed and stable Respiratory status: spontaneous breathing, nonlabored ventilation, respiratory function stable and patient connected to nasal cannula oxygen Cardiovascular status: blood pressure returned to baseline and stable Postop Assessment: no apparent nausea or vomiting Anesthetic complications: no   No complications documented.   Last Vitals:  Vitals:   09/20/20 0930 09/20/20 0940  BP: 131/71   Pulse: 74 74  Resp: 16 20  Temp:    SpO2: 100% 100%    Last Pain:  Vitals:   09/20/20 0910  TempSrc: Temporal  PainSc:                  Arita Miss

## 2020-09-20 NOTE — Anesthesia Preprocedure Evaluation (Signed)
Anesthesia Evaluation  Patient identified by MRN, date of birth, ID band Patient awake  General Assessment Comment:Patient's sister was told she received a non-depolarizing muscle blocker and took a long time to wake up after an Amb Surg center surgery, necessitating transfer to hospital. Patient herself has had anesthetics without issue  Reviewed: Allergy & Precautions, H&P , NPO status , Patient's Chart, lab work & pertinent test results, reviewed documented beta blocker date and time   History of Anesthesia Complications (+) Family history of anesthesia reaction and history of anesthetic complications  Airway Mallampati: II  TM Distance: >3 FB Neck ROM: full    Dental no notable dental hx. (+) Teeth Intact, Dental Advidsory Given, Partial Upper   Pulmonary neg pulmonary ROS, Patient abstained from smoking.Not current smoker, former smoker,    Pulmonary exam normal breath sounds clear to auscultation       Cardiovascular Exercise Tolerance: Good hypertension, On Medications (-) angina(-) CAD, (-) Past MI, (-) Cardiac Stents and (-) CABG + dysrhythmias Atrial Fibrillation (-) Valvular Problems/Murmurs Rhythm:Regular Rate:Normal - Systolic murmurs    Neuro/Psych negative neurological ROS  negative psych ROS   GI/Hepatic GERD  Medicated,NAFLD   Endo/Other  negative endocrine ROS  Renal/GU negative Renal ROS  negative genitourinary   Musculoskeletal   Abdominal   Peds  Hematology negative hematology ROS (+)   Anesthesia Other Findings Past Medical History:   Abdominal pain                                  2013         Bladder wall thickening                                      Constipation                                                 Hyperlipidemia                                               Hypertension                                                 Arthritis                                                     Obesity (BMI 30-39.9)                                        GERD (gastroesophageal reflux disease)                       Family history of adverse reaction to anesthes*  Past Surgical History:   EYE SURGERY                                                   ABDOMINAL HYSTERECTOMY                                        CHOLECYSTECTOMY                                               ROTATOR CUFF REPAIR                             Left              HERNIA REPAIR                                    2013           Comment:umbilical   HERNIA REPAIR                                    09/27/2012       Comment:ventral hernia BMI    Body Mass Index   32.54 kg/m 2     Reproductive/Obstetrics negative OB ROS                             Anesthesia Physical  Anesthesia Plan  ASA: III  Anesthesia Plan: General   Post-op Pain Management:    Induction: Intravenous  PONV Risk Score and Plan: 3 and Propofol infusion and TIVA  Airway Management Planned: Nasal Cannula and Natural Airway  Additional Equipment: None  Intra-op Plan:   Post-operative Plan:   Informed Consent: I have reviewed the patients History and Physical, chart, labs and discussed the procedure including the risks, benefits and alternatives for the proposed anesthesia with the patient or authorized representative who has indicated his/her understanding and acceptance.     Dental Advisory Given  Plan Discussed with: CRNA  Anesthesia Plan Comments: (Discussed risks of anesthesia with patient, including possibility of difficulty with spontaneous ventilation under anesthesia necessitating airway intervention, PONV, and rare risks such as cardiac or respiratory or neurological events. Patient understands.)        Anesthesia Quick Evaluation

## 2020-09-20 NOTE — Transfer of Care (Signed)
Immediate Anesthesia Transfer of Care Note  Patient: Jodi Stein  Procedure(s) Performed: COLONOSCOPY (N/A ) ESOPHAGOGASTRODUODENOSCOPY (EGD) WITH PROPOFOL (N/A )  Patient Location: PACU  Anesthesia Type:General  Level of Consciousness: sedated  Airway & Oxygen Therapy: Patient Spontanous Breathing and Patient connected to nasal cannula oxygen  Post-op Assessment: Report given to RN and Post -op Vital signs reviewed and stable  Post vital signs: Reviewed and stable  Last Vitals:  Vitals Value Taken Time  BP 112/63 09/20/20 0916  Temp    Pulse 89 09/20/20 0917  Resp 22 09/20/20 0917  SpO2 100 % 09/20/20 0917  Vitals shown include unvalidated device data.  Last Pain:  Vitals:   09/20/20 0717  TempSrc: Temporal  PainSc: 0-No pain         Complications: No complications documented.

## 2020-09-20 NOTE — Interval H&P Note (Signed)
History and Physical Interval Note:  09/20/2020 8:37 AM  Jodi Stein  has presented today for surgery, with the diagnosis of occult blood and GERD.  The various methods of treatment have been discussed with the patient and family. After consideration of risks, benefits and other options for treatment, the patient has consented to  Procedure(s): COLONOSCOPY (N/A) ESOPHAGOGASTRODUODENOSCOPY (EGD) WITH PROPOFOL (N/A) as a surgical intervention.  The patient's history has been reviewed, patient examined, no change in status, stable for surgery.  I have reviewed the patient's chart and labs.  Questions were answered to the patient's satisfaction.     [place], [place]

## 2020-09-20 NOTE — Op Note (Signed)
[facility] Gastroenterology Patient Name: Jodi Stein Procedure Date: 09/20/2020 7:57 AM MRN: [id number] Account #: [id number] Date of Birth: 03/10/1941 Admit Type: Outpatient Age: 80 Room: [facility] ENDO ROOM 2 Gender: Female Note Status: Finalized Procedure:             Upper GI endoscopy Indications:           Gastro-esophageal reflux disease Providers:             Benay Pike. Alice Reichert MD, MD Referring MD:          Baxter Hire, MD (Referring MD) Medicines:             Propofol per Anesthesia Complications:         No immediate complications. Procedure:             Pre-Anesthesia Assessment:                        - The risks and benefits of the procedure and the                         sedation options and risks were discussed with the                         patient. All questions were answered and informed                         consent was obtained.                        - Patient identification and proposed procedure were                         verified prior to the procedure by the nurse. The                         procedure was verified in the procedure room.                        - ASA Grade Assessment: III - A patient with severe                         systemic disease.                        - After reviewing the risks and benefits, the patient                         was deemed in satisfactory condition to undergo the                         procedure.                        After obtaining informed consent, the endoscope was                         passed under direct vision. Throughout the procedure,                         the patient's blood pressure,  pulse, and oxygen                         saturations were monitored continuously. The Endoscope                         was introduced through the mouth, and advanced to the                         third part of duodenum. The upper GI endoscopy was                         accomplished without  difficulty. The patient tolerated                         the procedure well. Findings:      A widely patent Schatzki ring was found in the lower third of the       esophagus.      A 1 cm hiatal hernia was present.      Patchy mildly erythematous mucosa without bleeding was found in the       gastric antrum.      The examined duodenum was normal.      There is no endoscopic evidence of Barrett's esophagus, areas of erosion       or esophagitis in the distal esophagus.      The exam was otherwise without abnormality. Impression:            - Widely patent Schatzki ring.                        - 1 cm hiatal hernia.                        - Erythematous mucosa in the antrum.                        - Normal examined duodenum.                        - The examination was otherwise normal.                        - No specimens collected. Recommendation:        - Patient has a contact number available for                         emergencies. The signs and symptoms of potential                         delayed complications were discussed with the patient.                         Return to normal activities tomorrow. Written                         discharge instructions were provided to the patient.                        - Proceed with colonoscopy Procedure Code(s):     --- Professional ---  20100, Esophagogastroduodenoscopy, flexible,                         transoral; diagnostic, including collection of                         specimen(s) by brushing or washing, when performed                         (separate procedure) Diagnosis Code(s):     --- Professional ---                        K21.9, Gastro-esophageal reflux disease without                         esophagitis                        K31.89, Other diseases of stomach and duodenum                        K44.9, Diaphragmatic hernia without obstruction or                         gangrene                         K22.2, Esophageal obstruction CPT copyright 2019 American Medical Association. All rights reserved. The codes documented in this report are preliminary and upon coder review may  be revised to meet current compliance requirements. Efrain Sella MD, MD 09/20/2020 8:59:09 AM This report has been signed electronically. Number of Addenda: 0 Note Initiated On: 09/20/2020 7:57 AM Estimated Blood Loss:  Estimated blood loss: none. Estimated blood loss: none.      [facility]

## 2020-09-21 LAB — SURGICAL PATHOLOGY

## 2020-09-23 ENCOUNTER — Encounter: Payer: Self-pay | Admitting: Internal Medicine

## 2020-09-27 ENCOUNTER — Other Ambulatory Visit: Payer: Medicare Other

## 2020-09-27 ENCOUNTER — Other Ambulatory Visit: Admission: RE | Admit: 2020-09-27 | Payer: Medicare Other | Source: Ambulatory Visit

## 2020-09-29 ENCOUNTER — Encounter: Admission: RE | Payer: Self-pay | Source: Home / Self Care

## 2020-09-29 ENCOUNTER — Ambulatory Visit: Admission: RE | Admit: 2020-09-29 | Payer: Medicare Other | Source: Home / Self Care | Admitting: Internal Medicine

## 2020-09-29 SURGERY — EGD (ESOPHAGOGASTRODUODENOSCOPY)
Anesthesia: General

## 2020-10-26 ENCOUNTER — Other Ambulatory Visit: Payer: Self-pay | Admitting: Internal Medicine

## 2020-10-26 DIAGNOSIS — Z1231 Encounter for screening mammogram for malignant neoplasm of breast: Secondary | ICD-10-CM

## 2020-11-12 ENCOUNTER — Other Ambulatory Visit: Payer: Self-pay

## 2020-11-12 ENCOUNTER — Ambulatory Visit
Admission: RE | Admit: 2020-11-12 | Discharge: 2020-11-12 | Disposition: A | Discharge status: Home / Self Care | Payer: Medicare Other | Source: Ambulatory Visit | Attending: Internal Medicine | Admitting: Internal Medicine

## 2020-11-12 DIAGNOSIS — Z1231 Encounter for screening mammogram for malignant neoplasm of breast: Secondary | ICD-10-CM | POA: Diagnosis not present

## 2021-10-05 ENCOUNTER — Other Ambulatory Visit: Payer: Self-pay | Admitting: Physical Medicine & Rehabilitation

## 2021-10-05 DIAGNOSIS — M48062 Spinal stenosis, lumbar region with neurogenic claudication: Secondary | ICD-10-CM

## 2021-10-08 ENCOUNTER — Observation Stay
Admission: EM | Admit: 2021-10-08 | Discharge: 2021-10-10 | Disposition: A | Discharge status: Home Health | Payer: Medicare Other | Attending: Internal Medicine | Admitting: Internal Medicine

## 2021-10-08 ENCOUNTER — Other Ambulatory Visit: Payer: Self-pay

## 2021-10-08 DIAGNOSIS — I4891 Unspecified atrial fibrillation: Secondary | ICD-10-CM | POA: Diagnosis present

## 2021-10-08 DIAGNOSIS — Z87891 Personal history of nicotine dependence: Secondary | ICD-10-CM | POA: Diagnosis not present

## 2021-10-08 DIAGNOSIS — K5909 Other constipation: Secondary | ICD-10-CM | POA: Diagnosis not present

## 2021-10-08 DIAGNOSIS — Z7901 Long term (current) use of anticoagulants: Secondary | ICD-10-CM | POA: Diagnosis not present

## 2021-10-08 DIAGNOSIS — Z20822 Contact with and (suspected) exposure to covid-19: Secondary | ICD-10-CM | POA: Diagnosis not present

## 2021-10-08 DIAGNOSIS — I130 Hypertensive heart and chronic kidney disease with heart failure and stage 1 through stage 4 chronic kidney disease, or unspecified chronic kidney disease: Secondary | ICD-10-CM | POA: Diagnosis not present

## 2021-10-08 DIAGNOSIS — I5032 Chronic diastolic (congestive) heart failure: Secondary | ICD-10-CM | POA: Insufficient documentation

## 2021-10-08 DIAGNOSIS — Z79899 Other long term (current) drug therapy: Secondary | ICD-10-CM | POA: Insufficient documentation

## 2021-10-08 DIAGNOSIS — I480 Paroxysmal atrial fibrillation: Secondary | ICD-10-CM | POA: Insufficient documentation

## 2021-10-08 DIAGNOSIS — R531 Weakness: Secondary | ICD-10-CM

## 2021-10-08 DIAGNOSIS — I10 Essential (primary) hypertension: Secondary | ICD-10-CM | POA: Diagnosis present

## 2021-10-08 DIAGNOSIS — K625 Hemorrhage of anus and rectum: Secondary | ICD-10-CM | POA: Diagnosis present

## 2021-10-08 DIAGNOSIS — N1832 Chronic kidney disease, stage 3b: Secondary | ICD-10-CM | POA: Diagnosis not present

## 2021-10-08 DIAGNOSIS — N183 Chronic kidney disease, stage 3 unspecified: Secondary | ICD-10-CM | POA: Diagnosis present

## 2021-10-08 DIAGNOSIS — E669 Obesity, unspecified: Secondary | ICD-10-CM | POA: Diagnosis present

## 2021-10-08 LAB — COMPREHENSIVE METABOLIC PANEL
ALT: 32 U/L (ref 0–44)
AST: 28 U/L (ref 15–41)
Albumin: 4.5 g/dL (ref 3.5–5.0)
Alkaline Phosphatase: 64 U/L (ref 38–126)
Anion gap: 9 (ref 5–15)
BUN: 23 mg/dL (ref 8–23)
CO2: 32 mmol/L (ref 22–32)
Calcium: 10 mg/dL (ref 8.9–10.3)
Chloride: 98 mmol/L (ref 98–111)
Creatinine, Ser: 1.21 mg/dL — ABNORMAL HIGH (ref 0.44–1.00)
GFR, Estimated: 45 mL/min — ABNORMAL LOW (ref >60)
Glucose, Bld: 89 mg/dL (ref 70–99)
Potassium: 3.1 mmol/L — ABNORMAL LOW (ref 3.5–5.1)
Sodium: 139 mmol/L (ref 135–145)
Total Bilirubin: 0.7 mg/dL (ref 0.3–1.2)
Total Protein: 7.7 g/dL (ref 6.5–8.1)

## 2021-10-08 LAB — HEMOGLOBIN AND HEMATOCRIT, BLOOD
HCT: 33.7 % — ABNORMAL LOW (ref 36.0–46.0)
Hemoglobin: 10.8 g/dL — ABNORMAL LOW (ref 12.0–15.0)

## 2021-10-08 LAB — PROTIME-INR
INR: 1.1 (ref 0.8–1.2)
Prothrombin Time: 13.8 seconds (ref 11.4–15.2)

## 2021-10-08 LAB — CBC
HCT: 37.6 % (ref 36.0–46.0)
Hemoglobin: 12 g/dL (ref 12.0–15.0)
MCH: 30 pg (ref 26.0–34.0)
MCHC: 31.9 g/dL (ref 30.0–36.0)
MCV: 94 fL (ref 80.0–100.0)
Platelets: 255 10*3/uL (ref 150–400)
RBC: 4 MIL/uL (ref 3.87–5.11)
RDW: 13.1 % (ref 11.5–15.5)
WBC: 7.3 10*3/uL (ref 4.0–10.5)
nRBC: 0 % (ref 0.0–0.2)

## 2021-10-08 LAB — TYPE AND SCREEN
ABO/RH(D): O POS
Antibody Screen: NEGATIVE

## 2021-10-08 MED ORDER — SODIUM CHLORIDE 0.9 % IV SOLN: INTRAVENOUS | Status: DC

## 2021-10-08 MED ORDER — ACETAMINOPHEN 650 MG RE SUPP: 650.0000 mg | Freq: Four times a day (QID) | RECTAL | Status: DC | PRN

## 2021-10-08 MED ORDER — ONDANSETRON HCL 4 MG/2ML IJ SOLN: 4.0000 mg | Freq: Four times a day (QID) | INTRAMUSCULAR | Status: DC | PRN

## 2021-10-08 MED ORDER — ATORVASTATIN CALCIUM 20 MG PO TABS
40.0000 mg | ORAL_TABLET | Freq: Every day | ORAL | Status: DC
  Administered 2021-10-08 – 2021-10-10 (×3): 40 mg via ORAL
  Filled 2021-10-08 (×4): qty 2

## 2021-10-08 MED ORDER — ACETAMINOPHEN 325 MG PO TABS
650.0000 mg | ORAL_TABLET | Freq: Four times a day (QID) | ORAL | Status: DC | PRN
  Administered 2021-10-08 – 2021-10-10 (×4): 650 mg via ORAL
  Filled 2021-10-08 (×4): qty 2

## 2021-10-08 MED ORDER — PANTOPRAZOLE SODIUM 40 MG IV SOLR
40.0000 mg | Freq: Once | INTRAVENOUS | Status: AC
  Administered 2021-10-08: 40 mg via INTRAVENOUS
  Filled 2021-10-08: qty 10

## 2021-10-08 MED ORDER — ONDANSETRON HCL 4 MG PO TABS: 4.0000 mg | ORAL_TABLET | Freq: Four times a day (QID) | ORAL | Status: DC | PRN

## 2021-10-08 NOTE — H&P (Signed)
History and Physical    Patient: Jodi Stein WRU:[id number] DOB: 15-Sep-1940 DOA: 10/08/2021 DOS: the patient was seen and examined on 10/08/2021 PCP: Gracelyn Nurse, MD  Patient coming from: Home  Chief Complaint:  Chief Complaint  Patient presents with   Rectal Bleeding   HPI: Jodi Stein is a 81 y.o. female with medical history significant for morbid obesity, diverticulosis, history of A-fib on anticoagulation, hypertension, GERD who presents to the ER for evaluation of 2 episodes of bright red blood per rectum.  Last episode was about 9:30 AM this morning. Patient states that she has been constipated for several weeks and has to strain to have a bowel movement despite being on laxatives. She denies any NSAID use and only takes Tylenol as needed for chronic back pain.  She denies having any hematemesis or passage of melena stools.  She has no abdominal pain and only complains of tightness in her upper abdomen. She complains of feeling dizzy and lightheaded but denies having any falls or loss of consciousness. She denies having any chest pain, no shortness of breath, no fever, no chills, no leg swelling, no focal deficit or blurred vision. Her vital signs are stable and hemoglobin is 12g/dl She will be referred to observation status for further evaluation. Review of Systems: As mentioned in the history of present illness. All other systems reviewed and are negative. Past Medical History:  Diagnosis Date   A-fib [facility])    Abdominal pain 2013   Arthritis    Bladder wall thickening    Constipation    Diverticulosis    Family history of adverse reaction to anesthesia    GERD (gastroesophageal reflux disease)    Hyperlipidemia    Hypertension    Neuroendocrine tumor    rectal   Obesity (BMI 30-39.9)    Steatosis of liver    Past Surgical History:  Procedure Laterality Date   ABDOMINAL HYSTERECTOMY     CHOLECYSTECTOMY     COLONOSCOPY N/A 09/20/2020   Procedure:  COLONOSCOPY;  Surgeon: Toledo, Boykin Nearing, MD;  Location: [facility] ENDOSCOPY;  Service: Gastroenterology;  Laterality: N/A;   COLONOSCOPY WITH PROPOFOL N/A 02/08/2016   Procedure: COLONOSCOPY WITH PROPOFOL;  Surgeon: Christena Deem, MD;  Location: [facility] ENDOSCOPY;  Service: Endoscopy;  Laterality: N/A;   ESOPHAGOGASTRODUODENOSCOPY (EGD) WITH PROPOFOL N/A 09/20/2020   Procedure: ESOPHAGOGASTRODUODENOSCOPY (EGD) WITH PROPOFOL;  Surgeon: Toledo, Boykin Nearing, MD;  Location: [facility] ENDOSCOPY;  Service: Gastroenterology;  Laterality: N/A;   EUS N/A 08/24/2016   Procedure: LOWER ENDOSCOPIC ULTRASOUND (EUS);  Surgeon: Rachael Fee, MD;  Location: [facility] ENDOSCOPY;  Service: Endoscopy;  Laterality: N/A;   EYE SURGERY     FLEXIBLE SIGMOIDOSCOPY N/A 08/08/2016   Procedure: FLEXIBLE SIGMOIDOSCOPY;  Surgeon: Christena Deem, MD;  Location: [facility] ENDOSCOPY;  Service: Endoscopy;  Laterality: N/A;   FLEXIBLE SIGMOIDOSCOPY N/A 04/04/2018   Procedure: FLEXIBLE SIGMOIDOSCOPY;  Surgeon: Christena Deem, MD;  Location: [facility] ENDOSCOPY;  Service: Endoscopy;  Laterality: N/A;   HERNIA REPAIR  2013   umbilical   HERNIA REPAIR  09/27/2012   ventral hernia   ROTATOR CUFF REPAIR Left    Social History:  reports that she quit smoking about 60 years ago. Her smoking use included cigarettes. She has a 0.50 pack-year smoking history. She has never used smokeless tobacco. She reports that she does not drink alcohol and does not use drugs.  Allergies  Allergen Reactions   Codeine Nausea And Vomiting   Tramadol Nausea And Vomiting  Family History  Problem Relation Age of Onset   Breast cancer Neg Hx     Prior to Admission medications   Medication Sig Start Date End Date Taking? Authorizing Provider  apixaban (ELIQUIS) 5 MG TABS tablet Take by mouth. 07/15/19   [provider]  atorvastatin (LIPITOR) 10 MG tablet Take 10 mg by mouth daily.    [provider]  atorvastatin (LIPITOR) 40 MG tablet  12/17/19    [provider]  Cholecalciferol 25 MCG (1000 UT) tablet Take by mouth. 11/07/19   [provider]  furosemide (LASIX) 20 MG tablet Take by mouth. 11/04/19   [provider]  gabapentin (NEURONTIN) 100 MG capsule  11/21/19   [provider]  lactulose (CHRONULAC) 10 GM/15ML solution Take 20 g by mouth daily as needed for mild constipation.  Patient not taking: Reported on 09/20/2020    [provider]  lidocaine (LIDODERM) 5 % Place 1 patch onto the skin daily. Remove & Discard patch within 12 hours or as directed by MD 08/21/18   Enid Derry, PA-C  lisinopril (PRINIVIL,ZESTRIL) 20 MG tablet Take 20 mg by mouth daily.    [provider]  metoprolol tartrate (LOPRESSOR) 100 MG tablet Take by mouth. 11/04/19 11/03/20  [provider]  naproxen (EC NAPROSYN) 500 MG EC tablet Take 1 tablet (500 mg total) by mouth 2 (two) times daily with a meal. 11/24/17   Baity, Salvadore Oxford, NP  omeprazole (PRILOSEC) 20 MG capsule Take 20 mg by mouth daily.    [provider]  polyethylene glycol (MIRALAX / GLYCOLAX) packet Take 17 g by mouth daily. Mix in 4-8oz fluid    [provider]    Physical Exam: Vitals:   10/08/21 1101 10/08/21 1102  BP:  119/84  Pulse:  97  Resp:  18  Temp:  98 F (36.7 C)  TempSrc:  Oral  SpO2:  97%  Weight: 83.9 kg   Height: 5\' 1"  (1.549 m)    Physical Exam Vitals and nursing note reviewed.  Constitutional:      Appearance: She is obese.  HENT:     Head: Normocephalic and atraumatic.     Nose: Nose normal.     Mouth/Throat:     Mouth: Mucous membranes are moist.  Eyes:     Pupils: Pupils are equal, round, and reactive to light.  Cardiovascular:     Rate and Rhythm: Regular rhythm. Tachycardia present.  Pulmonary:     Effort: Pulmonary effort is normal.     Breath sounds: Normal breath sounds.  Abdominal:     General: Bowel sounds are normal.     Palpations: Abdomen is soft.     Comments:  Central adiposity  Musculoskeletal:        General: Normal range of motion.     Cervical back: Normal range of motion.  Skin:    General: Skin is warm and dry.  Neurological:     General: No focal deficit present.     Mental Status: She is alert and oriented to person, place, and time.  Psychiatric:        Mood and Affect: Mood normal.        Behavior: Behavior normal.    Data Reviewed: Relevant notes from primary care and specialist visits, past discharge summaries as available in EHR, including Care Everywhere. Prior diagnostic testing as pertinent to current admission diagnoses Updated medications and problem lists for reconciliation ED course, including vitals, labs, imaging, treatment and  response to treatment Triage notes, nursing and pharmacy notes and ED provider's notes Notable results as noted in HPI Labs reviewed and essentially negative except for potassium of 3.1, serum creatinine of 1.21, BUN of 23, INR 1.1, hemoglobin 12 There are no new results to review at this time.  Assessment and Plan: * Rectal bleeding Patient presents for evaluation of 2 episodes of passage of painless bright red blood per rectum. She is on Eliquis as primary prophylaxis for acute stroke She had a colonoscopy done a year ago which showed diverticulosis as well as internal hemorrhoids We will obtain serial H&H We will transfuse for hemoglobin less than 7 We will request GI consult  CKD (chronic kidney disease) stage 3, GFR 30-59 ml/min (HCC) Patient has a history of stage IIIb chronic kidney disease related to hypertension Renal function is stable Monitor closely during this hospitalization  A-fib [facility]) Patient has a history of chronic A-fib on chronic anticoagulation therapy with Eliquis and presents for evaluation of rectal bleeding. Hold Eliquis for now Hold metoprolol due to relative hypotension  Obesity (BMI 30-39.9) Complicates overall prognosis and care Lifestyle modification  and exercise has been discussed with patient in detail  Hypertension Hold lisinopril and metoprolol due to relative hypotension  Chronic constipation Patient has a history of chronic constipation for which she takes lactulose She continues to strain to have a bowel movement despite being compliant her lactulose. Will benefit from daily MiraLAX      Advance Care Planning:   Code Status: Full Code   Consults: Gastroenterology  Family Communication: Greater than 50% of time was spent discussing patient's condition and plan of care with her and her family at the bedside.  All questions and concerns have been addressed.  They verbalized understanding and agree with the plan.  Severity of Illness: The appropriate patient status for this patient is OBSERVATION. Observation status is judged to be reasonable and necessary in order to provide the required intensity of service to ensure the patient's safety. The patient's presenting symptoms, physical exam findings, and initial radiographic and laboratory data in the context of their medical condition is felt to place them at decreased risk for further clinical deterioration. Furthermore, it is anticipated that the patient will be medically stable for discharge from the hospital within 2 midnights of admission.   Author: Lucile Shutters, MD 10/08/2021 12:29 PM  For on call review [email or web address].

## 2021-10-08 NOTE — ED Triage Notes (Signed)
Pt states this morning she felt like she needed to have a BM and when she went there was nothing but bright red blood in the toilet- pt states her stomach started to feel like she had to go again but when she went it was all blood again- pt endorses some dizziness ?

## 2021-10-08 NOTE — Assessment & Plan Note (Signed)
Patient presents for evaluation of 2 episodes of passage of painless bright red blood per rectum. ?She is on Eliquis as primary prophylaxis for acute stroke ?She had a colonoscopy done a year ago which showed diverticulosis as well as internal hemorrhoids ?We will obtain serial H&H ?We will transfuse for hemoglobin less than 7 ?We will request GI consult ?

## 2021-10-08 NOTE — Assessment & Plan Note (Signed)
Patient has a history of chronic constipation for which she takes lactulose ?She continues to strain to have a bowel movement despite being compliant her lactulose. ?Will benefit from daily MiraLAX ?

## 2021-10-08 NOTE — ED Provider Notes (Signed)
? ?[facility] ?Provider Note ? ? ? Event Date/Time  ? First MD Initiated Contact with Patient 10/08/21 1057   ?  (approximate) ? ? ?History  ? ?Rectal Bleeding ? ? ?HPI ? ?Jodi Stein is a 81 y.o. female on Eliquis for history of A-fib presents to the ER for evaluation of 2 episodes of bright red blood per rectum is painless.  Started this morning.  Does feel she has the urge to defecate has been having some epigastric pain and has a history of reflux does take Prilosec for that.  Denies any fevers.  States that the bowel at this morning was pure blood and did feel the commode twice.  Does feel little bit lightheaded and weak.   ? ?Review of endoscopy and colonoscopy performed in 2022 patient did have evidence of Schatzki ring hiatal hernia but no other acute gastric abnormality no varices.  Colonoscopy showed that she did have internal hemorrhoid but also diverticulosis and 2 polyps that were resected. ? ?  ? ? ?Physical Exam  ? ?Triage Vital Signs: ?ED Triage Vitals  ?Enc Vitals Group  ?   BP   ?   Pulse   ?   Resp   ?   Temp   ?   Temp src   ?   SpO2   ?   Weight   ?   Height   ?   Head Circumference   ?   Peak Flow   ?   Pain Score   ?   Pain Loc   ?   Pain Edu?   ?   Excl. in [place]?   ? ? ?Most recent vital signs: ?Vitals:  ? 10/08/21 1102  ?BP: 119/84  ?Pulse: 97  ?Resp: 18  ?Temp: 98 ?F (36.7 ?C)  ?SpO2: 97%  ? ? ? ?Constitutional: Alert  ?Eyes: Conjunctivae are normal.  ?Head: Atraumatic. ?Nose: No congestion/rhinnorhea. ?Mouth/Throat: Mucous membranes are moist.   ?Neck: Painless ROM.  ?Cardiovascular:   Good peripheral circulation. ?Respiratory: Normal respiratory effort.  No retractions.  ?Gastrointestinal: Soft and nontender in all four quadrants ?Musculoskeletal:  no deformity ?Neurologic:  MAE spontaneously. No gross focal neurologic deficits are appreciated.  ?Skin:  Skin is warm, dry and intact. No rash noted. ?Psychiatric: Mood and affect are normal. Speech and behavior are  normal. ? ? ? ?ED Results / Procedures / Treatments  ? ?Labs ?(all labs ordered are listed, but only abnormal results are displayed) ?Labs Reviewed  ?CBC  ?COMPREHENSIVE METABOLIC PANEL  ?PROTIME-INR  ?TYPE AND SCREEN  ? ? ? ?EKG ? ? ? ? ?RADIOLOGY ? ? ?PROCEDURES: ? ?Critical Care performed: No ? ?Procedures ? ? ?MEDICATIONS ORDERED IN ED: ?Medications  ?pantoprazole (PROTONIX) injection 40 mg (has no administration in time range)  ? ? ? ?IMPRESSION / MDM / ASSESSMENT AND PLAN / ED COURSE  ?I reviewed the triage vital signs and the nursing notes. ?             ?               ? ?Differential diagnosis includes, but is not limited to, ugib, lgib, diverticular bleed, bleeding interal hemorrhoids ? ?Presenting with multiple episodes of bright red blood per rectum this morning she is on Eliquis complicating her presentation.  Her abdominal exam is soft benign does have a history of reflux as well as diverticulosis and internal hemorrhoids.  She arrives hemodynamically stable her hemoglobin is normal no white  count.  Based on her age and risk factors especially if she is on Eliquis I do believe she will require hospitalization for observation and serial hemoglobins.  Hospitalist was consulted for admission and agreed to accept patient to their service. ? ? ?  ? ? ?FINAL CLINICAL IMPRESSION(S) / ED DIAGNOSES  ? ?Final diagnoses:  ?Rectal bleeding  ? ? ? ?Rx / DC Orders  ? ?ED Discharge Orders   ? ? None  ? ?  ? ? ? ?Note:  This document was prepared using Dragon voice recognition software and may include unintentional dictation errors. ? ?  ?Merlyn Lot, MD ?10/08/21 1137 ? ?

## 2021-10-08 NOTE — Consult Note (Signed)
Consultation ? ?Referring Provider:     Hospitalist ?Admit date: 3/18 ?Consult date: 3/18         ?Reason for Consultation:    Rectal bleeding  ?       ? HPI:   ?Jodi Stein is a 81 y.o. lady with history of constipation, a. Fib on DOAC, obesity and GERD here for acute onset of bright red blood per rectum with last episode being at 9:30 this morning. Patient is hemodynamically stable and has normal hemoglobin. Patient had colonoscopy about one year ago with diverticulosis and hemorrhoids. Last dose of eliquis was this morning. States that she has to strain with each bowel movement despite laxatives. No other complaints currently. She does not take any NSAIDS. No abdominal pain only a mild discomfort. ? ? ?Past Medical History:  ?Diagnosis Date  ? A-fib ([place])   ? Abdominal pain 2013  ? Arthritis   ? Bladder wall thickening   ? Constipation   ? Diverticulosis   ? Family history of adverse reaction to anesthesia   ? GERD (gastroesophageal reflux disease)   ? Hyperlipidemia   ? Hypertension   ? Neuroendocrine tumor   ? rectal  ? Obesity (BMI 30-39.9)   ? Steatosis of liver   ? ? ?Past Surgical History:  ?Procedure Laterality Date  ? ABDOMINAL HYSTERECTOMY    ? CHOLECYSTECTOMY    ? COLONOSCOPY N/A 09/20/2020  ? Procedure: COLONOSCOPY;  Surgeon: Toledo, Benay Pike, MD;  Location: [facility] ENDOSCOPY;  Service: Gastroenterology;  Laterality: N/A;  ? COLONOSCOPY WITH PROPOFOL N/A 02/08/2016  ? Procedure: COLONOSCOPY WITH PROPOFOL;  Surgeon: Lollie Sails, MD;  Location: [facility] ENDOSCOPY;  Service: Endoscopy;  Laterality: N/A;  ? ESOPHAGOGASTRODUODENOSCOPY (EGD) WITH PROPOFOL N/A 09/20/2020  ? Procedure: ESOPHAGOGASTRODUODENOSCOPY (EGD) WITH PROPOFOL;  Surgeon: Toledo, Benay Pike, MD;  Location: [facility] ENDOSCOPY;  Service: Gastroenterology;  Laterality: N/A;  ? EUS N/A 08/24/2016  ? Procedure: LOWER ENDOSCOPIC ULTRASOUND (EUS);  Surgeon: Milus Banister, MD;  Location: [facility] ENDOSCOPY;  Service: Endoscopy;  Laterality: N/A;  ? EYE  SURGERY    ? FLEXIBLE SIGMOIDOSCOPY N/A 08/08/2016  ? Procedure: FLEXIBLE SIGMOIDOSCOPY;  Surgeon: Lollie Sails, MD;  Location: [facility] ENDOSCOPY;  Service: Endoscopy;  Laterality: N/A;  ? FLEXIBLE SIGMOIDOSCOPY N/A 04/04/2018  ? Procedure: FLEXIBLE SIGMOIDOSCOPY;  Surgeon: Lollie Sails, MD;  Location: [facility] ENDOSCOPY;  Service: Endoscopy;  Laterality: N/A;  ? HERNIA REPAIR  2952  ? umbilical  ? HERNIA REPAIR  09/27/2012  ? ventral hernia  ? ROTATOR CUFF REPAIR Left   ? ? ?Family History  ?Problem Relation Age of Onset  ? Breast cancer Neg Hx   ? ? ?Social History  ? ?Tobacco Use  ? Smoking status: Former  ?  Packs/day: 0.25  ?  Years: 2.00  ?  Pack years: 0.50  ?  Types: Cigarettes  ?  Quit date: 07/24/1961  ?  Years since quitting: 60.2  ? Smokeless tobacco: Never  ?Vaping Use  ? Vaping Use: Never used  ?Substance Use Topics  ? Alcohol use: No  ? Drug use: No  ? ? ?Prior to Admission medications   ?Medication Sig Start Date End Date Taking? Authorizing Provider  ?apixaban (ELIQUIS) 5 MG TABS tablet Take by mouth. 07/15/19   [provider]  ?atorvastatin (LIPITOR) 10 MG tablet Take 10 mg by mouth daily.    [provider]  ?atorvastatin (LIPITOR) 40 MG tablet  12/17/19   [provider]  ?Cholecalciferol 25 MCG (  1000 UT) tablet Take by mouth. 11/07/19   [provider]  ?furosemide (LASIX) 20 MG tablet Take by mouth. 11/04/19   [provider]  ?gabapentin (NEURONTIN) 100 MG capsule  11/21/19   [provider]  ?lactulose (CHRONULAC) 10 GM/15ML solution Take 20 g by mouth daily as needed for mild constipation.  ?Patient not taking: Reported on 09/20/2020    [provider]  ?lidocaine (LIDODERM) 5 % Place 1 patch onto the skin daily. Remove & Discard patch within 12 hours or as directed by MD 08/21/18   Laban Emperor, PA-C  ?lisinopril (PRINIVIL,ZESTRIL) 20 MG tablet Take 20 mg by mouth daily.    [provider]  ?metoprolol tartrate (LOPRESSOR)  100 MG tablet Take by mouth. 11/04/19 11/03/20  [provider]  ?naproxen (EC NAPROSYN) 500 MG EC tablet Take 1 tablet (500 mg total) by mouth 2 (two) times daily with a meal. 11/24/17   Jearld Fenton, NP  ?omeprazole (PRILOSEC) 20 MG capsule Take 20 mg by mouth daily.    [provider]  ?polyethylene glycol (MIRALAX / GLYCOLAX) packet Take 17 g by mouth daily. Mix in 4-8oz fluid    [provider]  ? ? ?Current Facility-Administered Medications  ?Medication Dose Route Frequency Provider Last Rate Last Admin  ? 0.9 %  sodium chloride infusion   Intravenous Continuous Agbata, Tochukwu, MD      ? acetaminophen (TYLENOL) tablet 650 mg  650 mg Oral Q6H PRN Agbata, Tochukwu, MD      ? Or  ? acetaminophen (TYLENOL) suppository 650 mg  650 mg Rectal Q6H PRN Agbata, Tochukwu, MD      ? atorvastatin (LIPITOR) tablet 40 mg  40 mg Oral Daily Agbata, Tochukwu, MD      ? ondansetron (ZOFRAN) tablet 4 mg  4 mg Oral Q6H PRN Agbata, Tochukwu, MD      ? Or  ? ondansetron (ZOFRAN) injection 4 mg  4 mg Intravenous Q6H PRN Agbata, Tochukwu, MD      ? ?Current Outpatient Medications  ?Medication Sig Dispense Refill  ? apixaban (ELIQUIS) 5 MG TABS tablet Take by mouth.    ? atorvastatin (LIPITOR) 10 MG tablet Take 10 mg by mouth daily.    ? atorvastatin (LIPITOR) 40 MG tablet     ? Cholecalciferol 25 MCG (1000 UT) tablet Take by mouth.    ? furosemide (LASIX) 20 MG tablet Take by mouth.    ? gabapentin (NEURONTIN) 100 MG capsule     ? lactulose (CHRONULAC) 10 GM/15ML solution Take 20 g by mouth daily as needed for mild constipation.  (Patient not taking: Reported on 09/20/2020)    ? lidocaine (LIDODERM) 5 % Place 1 patch onto the skin daily. Remove & Discard patch within 12 hours or as directed by MD 30 patch 0  ? lisinopril (PRINIVIL,ZESTRIL) 20 MG tablet Take 20 mg by mouth daily.    ? metoprolol tartrate (LOPRESSOR) 100 MG tablet Take by mouth.    ? naproxen (EC NAPROSYN) 500 MG EC tablet Take 1 tablet (500  mg total) by mouth 2 (two) times daily with a meal. 30 tablet 0  ? omeprazole (PRILOSEC) 20 MG capsule Take 20 mg by mouth daily.    ? polyethylene glycol (MIRALAX / GLYCOLAX) packet Take 17 g by mouth daily. Mix in 4-8oz fluid    ? ? ?Allergies as of 10/08/2021 - Review Complete 10/08/2021  ?Allergen Reaction Noted  ? Codeine Nausea And Vomiting 02/07/2016  ? Tramadol  Nausea And Vomiting 02/07/2016  ? ? ? ?Review of Systems:    ?All systems reviewed and negative except where noted in HPI. ? ?Review of Systems  ?Constitutional:  Negative for chills and fever.  ?Respiratory:  Negative for shortness of breath.   ?Cardiovascular:  Negative for chest pain.  ?Gastrointestinal:  Positive for blood in stool and constipation.  ?Musculoskeletal:  Positive for joint pain.  ?Skin:  Negative for itching and rash.  ?Neurological:  Negative for focal weakness.  ?Psychiatric/Behavioral:  Negative for substance abuse.   ?All other systems reviewed and are negative.  ? ? ? Physical Exam:  ?Vital signs in last 24 hours: ?Temp:  [98 ?F (36.7 ?C)] 98 ?F (36.7 ?C) (03/18 1102) ?Pulse Rate:  [97] 97 (03/18 1102) ?Resp:  [18] 18 (03/18 1102) ?BP: (119)/(84) 119/84 (03/18 1102) ?SpO2:  [97 %] 97 % (03/18 1102) ?Weight:  [83.9 kg] 83.9 kg (03/18 1101) ?  ?General:   Pleasant in NAD ?Head:  Normocephalic and atraumatic. ?Eyes:   No icterus.   Conjunctiva pink. ?Mouth: Mucosa pink moist, no lesions. ?Neck:  Supple; no masses felt ?Lungs:  No respiratory distress ?Abdomen:   slightly distended ?Rectal:  Not performed.  ?Msk:  MAEW x4, No clubbing or cyanosis. Strength 5/5. Symmetrical without gross deformities. ?Neurologic:  Alert and  oriented x4;  Cranial nerves II-XII intact.  ?Skin:  Warm, dry, pink without significant lesions or rashes. ?Psych:  Alert and cooperative. Normal affect. ? ?LAB RESULTS: ?Recent Labs  ?  10/08/21 ?1121  ?WBC 7.3  ?HGB 12.0  ?HCT 37.6  ?PLT 255  ? ?BMET ?Recent Labs  ?  10/08/21 ?1121  ?NA 139  ?K 3.1*  ?CL 98   ?CO2 32  ?GLUCOSE 89  ?BUN 23  ?CREATININE 1.21*  ?CALCIUM 10.0  ? ?LFT ?Recent Labs  ?  10/08/21 ?1121  ?PROT 7.7  ?ALBUMIN 4.5  ?AST 28  ?ALT 32  ?ALKPHOS 64  ?BILITOT 0.7  ? ?PT/INR ?Recent Labs  ?  03

## 2021-10-08 NOTE — Assessment & Plan Note (Signed)
Hold lisinopril and metoprolol due to relative hypotension ?

## 2021-10-08 NOTE — Assessment & Plan Note (Signed)
Patient has a history of chronic A-fib on chronic anticoagulation therapy with Eliquis and presents for evaluation of rectal bleeding. ?Hold Eliquis for now ?Hold metoprolol due to relative hypotension ?

## 2021-10-08 NOTE — Assessment & Plan Note (Signed)
Complicates overall prognosis and care ?Lifestyle modification and exercise has been discussed with patient in detail ?

## 2021-10-08 NOTE — ED Notes (Signed)
Pt ambulated to bathroom for BM. Pt had a loose stool with no visible blood.  ?

## 2021-10-08 NOTE — Assessment & Plan Note (Signed)
Patient has a history of stage IIIb chronic kidney disease related to hypertension ?Renal function is stable ?Monitor closely during this hospitalization ?

## 2021-10-09 ENCOUNTER — Encounter: Payer: Self-pay | Admitting: Internal Medicine

## 2021-10-09 DIAGNOSIS — K625 Hemorrhage of anus and rectum: Secondary | ICD-10-CM | POA: Diagnosis not present

## 2021-10-09 LAB — BASIC METABOLIC PANEL
Anion gap: 7 (ref 5–15)
BUN: 17 mg/dL (ref 8–23)
CO2: 27 mmol/L (ref 22–32)
Calcium: 8.8 mg/dL — ABNORMAL LOW (ref 8.9–10.3)
Chloride: 105 mmol/L (ref 98–111)
Creatinine, Ser: 1.08 mg/dL — ABNORMAL HIGH (ref 0.44–1.00)
GFR, Estimated: 52 mL/min — ABNORMAL LOW (ref >60)
Glucose, Bld: 92 mg/dL (ref 70–99)
Potassium: 3.5 mmol/L (ref 3.5–5.1)
Sodium: 139 mmol/L (ref 135–145)

## 2021-10-09 LAB — HEMOGLOBIN AND HEMATOCRIT, BLOOD
HCT: 32 % — ABNORMAL LOW (ref 36.0–46.0)
Hemoglobin: 10.2 g/dL — ABNORMAL LOW (ref 12.0–15.0)

## 2021-10-09 MED ORDER — POLYETHYLENE GLYCOL 3350 17 G PO PACK
17.0000 g | PACK | Freq: Two times a day (BID) | ORAL | Status: DC
  Administered 2021-10-09 – 2021-10-10 (×3): 17 g via ORAL
  Filled 2021-10-09 (×3): qty 1

## 2021-10-09 MED ORDER — PSYLLIUM 95 % PO PACK
1.0000 | PACK | Freq: Every day | ORAL | Status: DC
  Administered 2021-10-09 – 2021-10-10 (×2): 1 via ORAL
  Filled 2021-10-09 (×2): qty 1

## 2021-10-09 NOTE — Progress Notes (Addendum)
? ? ? ?Progress Note  ? ? ?Jodi Stein  XTA:[id number] DOB: [birth year]  DOA: 10/08/2021 ?PCP: Baxter Hire, MD  ? ? ? ? ?Brief Narrative:  ? ? ?Medical records reviewed and are as summarized below: ? ?Jodi Stein is a 81 y.o. female history significant for obesity, diverticulosis, constipation, paroxysmal atrial fibrillation on Eliquis, hypertension, GERD, who presented to the emergency room after 2 episodes of bright red rectal bleeding.  She has problems with constipation and has been straining at stool lately despite taking laxatives.  Recent colonoscopy about a year prior to admission showed diverticulosis and internal hemorrhoids. ? ? ? ? ? ?Assessment/Plan:  ? ?Principal Problem: ?  Rectal bleeding ?Active Problems: ?  Chronic constipation ?  Hypertension ?  Obesity (BMI 30-39.9) ?  A-fib ([place]) ?  CKD (chronic kidney disease) stage 3, GFR 30-59 ml/min (HCC) ? ? ? ?Body mass index is 34.96 kg/m?.  (Obesity) ? ?Rectal bleeding: This is probably from diverticular bleed.  No bleeding since admission.  Trial of soft diet today.  Monitor for rebleeding. ? ?Mild acute blood loss anemia: No indication for blood transfusion.  Monitor H&H. ? ?Constipation: Continue laxatives ? ?Hypokalemia: Improved. ? ?Hypotension: BP is better.  Antihypertensives have been held. ? ?Paroxysmal atrial fibrillation: Eliquis has been held. ? ? ? ? ?Diet Order   ? ?       ?  DIET SOFT Room service appropriate? Yes; Fluid consistency: Thin  Diet effective now       ?  ? ?  ?  ? ?  ? ? ? ? ? ? ? ?Assessment and Plan: ?* Rectal bleeding ?Patient presents for evaluation of 2 episodes of passage of painless bright red blood per rectum. ?She is on Eliquis as primary prophylaxis for acute stroke ?She had a colonoscopy done a year ago which showed diverticulosis as well as internal hemorrhoids ?We will obtain serial H&H ?We will transfuse for hemoglobin less than 7 ?We will request GI consult ? ?CKD (chronic kidney disease) stage 3, GFR  30-59 ml/min (HCC) ?Patient has a history of stage IIIb chronic kidney disease related to hypertension ?Renal function is stable ?Monitor closely during this hospitalization ? ?A-fib ([place]) ?Patient has a history of chronic A-fib on chronic anticoagulation therapy with Eliquis and presents for evaluation of rectal bleeding. ?Hold Eliquis for now ?Hold metoprolol due to relative hypotension ? ?Obesity (BMI 30-39.9) ?Complicates overall prognosis and care ?Lifestyle modification and exercise has been discussed with patient in detail ? ?Hypertension ?Hold lisinopril and metoprolol due to relative hypotension ? ?Chronic constipation ?Patient has a history of chronic constipation for which she takes lactulose ?She continues to strain to have a bowel movement despite being compliant her lactulose. ?Will benefit from daily MiraLAX ? ? ? ? ? ? ? ? ? ? ? ?Consultants: ?Gastroenterologist ? ?Procedures: ?None ? ? ? ?Medications:  ? ? atorvastatin  40 mg Oral Daily  ? polyethylene glycol  17 g Oral BID  ? psyllium  1 packet Oral Daily  ? ?Continuous Infusions: ? sodium chloride 100 mL/hr at 10/09/21 0108  ? ? ? ?Anti-infectives (From admission, onward)  ? ? None  ? ?  ? ? ? ? ? ? ? ? ? ?Family Communication/Anticipated D/C date and plan/Code Status  ? ?DVT prophylaxis: SCDs Start: 10/08/21 1149 ? ? ?  Code Status: Full Code ? ?Family Communication: None ?Disposition Plan: Plan to discharge home tomorrow ? ? ?Status is: Observation ?The patient  will require care spanning > 2 midnights and should be moved to inpatient because: Observe for rectal bleeding ? ? ? ? ? ? ?Subjective:  ? ?Interval events nonproductive.  No rectal bleeding since admission.  No abdominal pain or vomiting. ? ?Objective:  ? ? ?Vitals:  ? 10/08/21 1511 10/08/21 1937 10/09/21 0355 10/09/21 0748  ?BP: (!) 98/57 123/67 120/66 125/74  ?Pulse: 87 99 93 90  ?Resp: '18 20 12 18  '$ ?Temp: 97.9 ?F (36.6 ?C) 98.3 ?F (36.8 ?C) 97.8 ?F (36.6 ?C) 98.5 ?F (36.9 ?C)   ?TempSrc: Oral  Oral Oral  ?SpO2: 98% 99% 100% 100%  ?Weight:      ?Height:      ? ?No data found. ? ? ?Intake/Output Summary (Last 24 hours) at 10/09/2021 1211 ?Last data filed at 10/08/2021 1845 ?Gross per 24 hour  ?Intake 135.72 ml  ?Output --  ?Net 135.72 ml  ? ?Filed Weights  ? 10/08/21 1101  ?Weight: 83.9 kg  ? ? ?Exam: ? ?GEN: NAD ?SKIN: No rash ?EYES: EOMI ?ENT: MMM ?CV: RRR ?PULM: CTA B ?ABD: soft, obese, NT, +BS ?CNS: AAO x 3, non focal ?EXT: No edema or tenderness ? ? ? ?  ? ? ?Data Reviewed:  ? ?I have personally reviewed following labs and imaging studies: ? ?Labs: ?Labs show the following:  ? ?Basic Metabolic Panel: ?Recent Labs  ?Lab 10/08/21 ?1121 10/09/21 ?0140  ?NA 139 139  ?K 3.1* 3.5  ?CL 98 105  ?CO2 32 27  ?GLUCOSE 89 92  ?BUN 23 17  ?CREATININE 1.21* 1.08*  ?CALCIUM 10.0 8.8*  ? ?GFR ?Estimated Creatinine Clearance: 40.1 mL/min (A) (by C-G formula based on SCr of 1.08 mg/dL (H)). ?Liver Function Tests: ?Recent Labs  ?Lab 10/08/21 ?1121  ?AST 28  ?ALT 32  ?ALKPHOS 64  ?BILITOT 0.7  ?PROT 7.7  ?ALBUMIN 4.5  ? ?No results for input(s): LIPASE, AMYLASE in the last 168 hours. ?No results for input(s): AMMONIA in the last 168 hours. ?Coagulation profile ?Recent Labs  ?Lab 10/08/21 ?1121  ?INR 1.1  ? ? ?CBC: ?Recent Labs  ?Lab 10/08/21 ?1121 10/08/21 ?1955 10/09/21 ?0140  ?WBC 7.3  --   --   ?HGB 12.0 10.8* 10.2*  ?HCT 37.6 33.7* 32.0*  ?MCV 94.0  --   --   ?PLT 255  --   --   ? ?Cardiac Enzymes: ?No results for input(s): CKTOTAL, CKMB, CKMBINDEX, TROPONINI in the last 168 hours. ?BNP (last 3 results) ?No results for input(s): PROBNP in the last 8760 hours. ?CBG: ?No results for input(s): GLUCAP in the last 168 hours. ?D-Dimer: ?No results for input(s): DDIMER in the last 72 hours. ?Hgb A1c: ?No results for input(s): HGBA1C in the last 72 hours. ?Lipid Profile: ?No results for input(s): CHOL, HDL, LDLCALC, TRIG, CHOLHDL, LDLDIRECT in the last 72 hours. ?Thyroid function studies: ?No results for  input(s): TSH, T4TOTAL, T3FREE, THYROIDAB in the last 72 hours. ? ?Invalid input(s): FREET3 ?Anemia work up: ?No results for input(s): VITAMINB12, FOLATE, FERRITIN, TIBC, IRON, RETICCTPCT in the last 72 hours. ?Sepsis Labs: ?Recent Labs  ?Lab 10/08/21 ?1121  ?WBC 7.3  ? ? ?Microbiology ?No results found for this or any previous visit (from the past 240 hour(s)). ? ?Procedures and diagnostic studies: ? ?No results found. ? ? ? ? ? ? ? ? ? ? ? ? LOS: 0 days  ? ?Dazha Kempa  ?[facility]  ? ?Pager on [email or web address]. If 7PM-7AM, please contact night-coverage at [email or web address] ? ? ? ? ?  10/09/2021, 12:11 PM  ? ? ? ? ? ? ? ? ? ?

## 2021-10-09 NOTE — TOC Initial Note (Signed)
Transition of Care (TOC) - Initial/Assessment Note  ? ? ?Patient Details  ?Name: Jodi Stein ?MRN: [id number] ?Date of Birth: 12-Oct-1940 ? ?Transition of Care (TOC) CM/SW Contact:    ?Teva Bronkema E Ona Roehrs, LCSW ?Phone Number: ?10/09/2021, 2:14 PM ? ?Clinical Narrative:                Spoke to patient regarding DC planning. ?Patient lives with a friend. PCP is Dr. Edwina Barth. Pharmacy is [facility]. Patient drives herself short distances, or has a friend take her longer distances out of [place]. ?No DME, HH or SNF history. ?Confirmed home address.  ?Patient is requesting home health to help with personal care. No agency preference. ?Referral accepted by Corene Cornea with [facility] who stated they can take for HHPT and OT, OT will do personal care/aide tasks. Asked MD for orders. ? ? ?Expected Discharge Plan: [place] ?Barriers to Discharge: Continued Medical Work up ? ? ?Patient Goals and CMS Choice ?Patient states their goals for this hospitalization and ongoing recovery are:: to return home, requesting home health ?CMS Medicare.gov Compare Post Acute Care list provided to:: Patient ?Choice offered to / list presented to : Patient ? ?Expected Discharge Plan and Services ?Expected Discharge Plan: [place] ?  ?  ?  ?Living arrangements for the past 2 months: [place] ?                ?  ?  ?  ?  ?  ?HH Arranged: PT, OT ?[place] Agency: [facility] ([place]) ?Date HH Agency Contacted: 10/09/21 ?  ?Representative spoke with at [place]: Corene Cornea ? ?Prior Living Arrangements/Services ?Living arrangements for the past 2 months:  ?Lives with:: Other (Comment) ?Patient language and need for interpreter reviewed:: Yes ?Do you feel safe going back to the place where you live?: Yes      ?Need for Family Participation in Patient Care: Yes (Comment) ?Care giver support system in place?: Yes (comment) ?  ?Criminal Activity/Legal Involvement Pertinent to Current  Situation/Hospitalization: No - Comment as needed ? ?Activities of Daily Living ?Home Assistive Devices/Equipment: None ?ADL Screening (condition at time of admission) ?Patient's cognitive ability adequate to safely complete daily activities?: Yes ?Is the patient deaf or have difficulty hearing?: No ?Does the patient have difficulty seeing, even when wearing glasses/contacts?: No ?Does the patient have difficulty concentrating, remembering, or making decisions?: No ?Patient able to express need for assistance with ADLs?: Yes ?Does the patient have difficulty dressing or bathing?: No ?Independently performs ADLs?: Yes (appropriate for developmental age) ?Does the patient have difficulty walking or climbing stairs?: No ?Weakness of Legs: None ?Weakness of Arms/Hands: None ? ?Permission Sought/Granted ?Permission sought to share information with : Customer service manager ?Permission granted to share information with : Yes, Verbal Permission Granted ?   ? Permission granted to share info w AGENCY: [place] agencies ?   ?   ? ?Emotional Assessment ?  ?  ?  ?Orientation: : Oriented to Self, Oriented to Place, Oriented to  Time, Oriented to Situation ?Alcohol / Substance Use: Not Applicable ?Psych Involvement: No (comment) ? ?Admission diagnosis:  Rectal bleeding [K62.5] ?Patient Active Problem List  ? Diagnosis Date Noted  ? Rectal bleeding 10/08/2021  ? CKD (chronic kidney disease) stage 3, GFR 30-59 ml/min (HCC)   ? Swelling of limb 12/16/2019  ? Pain in limb 12/16/2019  ? Neuropathy 09/17/2019  ? A-fib ([place]) 08/22/2019  ? Lower extremity edema 08/08/2019  ?  Chronic left shoulder pain 10/29/2017  ? Chronic pain of right knee 10/29/2017  ? Chronic pain of both shoulders 05/01/2017  ? Rectal carcinoid tumor   ? Neuroendocrine tumor 02/08/2016  ? Obesity (BMI 30-39.9) 06/25/2015  ? Steatosis of liver 06/25/2015  ? Chronic constipation 12/16/2014  ? Closed fracture of phalanx of right second toe 07/02/2014  ? Bladder wall  thickening 05/01/2013  ? GERD (gastroesophageal reflux disease) 09/26/2012  ? Hyperlipidemia 09/26/2012  ? Hypertension 09/26/2012  ? Ventral hernia 09/15/2012  ? ?PCP:  Baxter Hire, MD ?Pharmacy:   ?[facility] [address] ?[address] ?[place] [address] ?Phone: [phone] Fax: [phone] ? ? ? ? ?Social Determinants of Health (SDOH) Interventions ?  ? ?Readmission Risk Interventions ?No flowsheet data found. ? ? ?

## 2021-10-09 NOTE — Progress Notes (Signed)
Patient complaining of stomach griping and a slight headache on right side.  Given tylenol with relief.  Her hemoglobin is trending down from 12.0 to 10.2.  Will continue to monitor.   ?Jodi Stein  10/09/2021  4:14 AM ? ? ? ?

## 2021-10-09 NOTE — Care Management Obs Status (Signed)
MEDICARE OBSERVATION STATUS NOTIFICATION ? ? ?Patient Details  ?Name: Jodi Stein ?MRN: [id number] ?Date of Birth: 29-May-1941 ? ? ?Medicare Observation Status Notification Given:  Yes ? ? ? ?Taleen Prosser E Camyah Pultz, LCSW ?10/09/2021, 2:09 PM ?

## 2021-10-09 NOTE — Progress Notes (Signed)
GI Inpatient Follow-up Note ? ?Subjective: ? ?Patient seen and no further bloody bowel movements. Hemoglobin drifted down which was expected. ? ?Scheduled Inpatient Medications:  ? atorvastatin  40 mg Oral Daily  ? polyethylene glycol  17 g Oral BID  ? psyllium  1 packet Oral Daily  ? ? ?Continuous Inpatient Infusions: ?  ? sodium chloride 100 mL/hr at 10/09/21 0108  ? ? ?PRN Inpatient Medications:  ?acetaminophen **OR** acetaminophen, ondansetron **OR** ondansetron (ZOFRAN) IV ? ?Review of Systems: ? ?Review of Systems  ?Constitutional:  Negative for fever.  ?Respiratory:  Negative for shortness of breath.   ?Cardiovascular:  Negative for chest pain.  ?Gastrointestinal:  Negative for abdominal pain, blood in stool, constipation, diarrhea and melena.  ?Musculoskeletal:  Negative for joint pain.  ?Neurological:  Negative for focal weakness.  ?Psychiatric/Behavioral:  Negative for substance abuse.   ?All other systems reviewed and are negative.  ?  ?Physical Examination: ?BP 125/74 (BP Location: Right Arm)   Pulse 90   Temp 98.5 ?F (36.9 ?C) (Oral)   Resp 18   Ht '5\' 1"'$  (1.549 m)   Wt 83.9 kg   SpO2 100%   BMI 34.96 kg/m?  ?Gen: NAD, alert and oriented x 4 ?HEENT: PEERLA, EOMI, ?Neck: supple, no JVD or thyromegaly ?Chest: No respiratory distress ?Abd: soft, non-tender, non-distended ?Ext: no edema, well perfused with 2+ pulses, ?Skin: no rash or lesions noted ?Lymph: no lymphadenopathy ? ?Data: ?Lab Results  ?Component Value Date  ? WBC 7.3 10/08/2021  ? HGB 10.2 (L) 10/09/2021  ? HCT 32.0 (L) 10/09/2021  ? MCV 94.0 10/08/2021  ? PLT 255 10/08/2021  ? ?Recent Labs  ?Lab 10/08/21 ?1121 10/08/21 ?1955 10/09/21 ?0140  ?HGB 12.0 10.8* 10.2*  ? ?Lab Results  ?Component Value Date  ? NA 139 10/09/2021  ? K 3.5 10/09/2021  ? CL 105 10/09/2021  ? CO2 27 10/09/2021  ? BUN 17 10/09/2021  ? CREATININE 1.08 (H) 10/09/2021  ? ?Lab Results  ?Component Value Date  ? ALT 32 10/08/2021  ? AST 28 10/08/2021  ? ALKPHOS 64  10/08/2021  ? BILITOT 0.7 10/08/2021  ? ?Recent Labs  ?Lab 10/08/21 ?1121  ?INR 1.1  ? ?Assessment/Plan: ?Ms. Scouten is a 81 y/o lady with constipation, known diverticulosis, and a.fib on DOAC who presents with bright red blood per rectum. This is likely a diverticular bleed given normal colonoscopy (besides diverticulosis and hemorrhoids) one year ago. No further bleeding but will continue to monitor ? ?Recommendations: ? ?- maintain active type and screen ?- transfuse for hemoglobin < 7 ?- maintain two large bore IV's ?- if hemodynamically significant recurrent bleeding, recommend CTA ?- ordered bowel regimen with miralax BID and daily psyllium ?- hold DOAC for now ?- daily CBC ?- advanced diet to soft ?- will continue to monitor for now ?  ?Please call with any questions or concerns. ? ?Raylene Miyamoto MD, MPH ?[facility]  ? ?  ?

## 2021-10-10 DIAGNOSIS — K625 Hemorrhage of anus and rectum: Secondary | ICD-10-CM | POA: Diagnosis not present

## 2021-10-10 LAB — CBC WITH DIFFERENTIAL/PLATELET
Abs Immature Granulocytes: 0.02 10*3/uL (ref 0.00–0.07)
Basophils Absolute: 0 10*3/uL (ref 0.0–0.1)
Basophils Relative: 0 %
Eosinophils Absolute: 0.1 10*3/uL (ref 0.0–0.5)
Eosinophils Relative: 1 %
HCT: 32.9 % — ABNORMAL LOW (ref 36.0–46.0)
Hemoglobin: 10.5 g/dL — ABNORMAL LOW (ref 12.0–15.0)
Immature Granulocytes: 0 %
Lymphocytes Relative: 40 %
Lymphs Abs: 2.3 10*3/uL (ref 0.7–4.0)
MCH: 30.3 pg (ref 26.0–34.0)
MCHC: 31.9 g/dL (ref 30.0–36.0)
MCV: 94.8 fL (ref 80.0–100.0)
Monocytes Absolute: 0.4 10*3/uL (ref 0.1–1.0)
Monocytes Relative: 6 %
Neutro Abs: 3 10*3/uL (ref 1.7–7.7)
Neutrophils Relative %: 53 %
Platelets: 224 10*3/uL (ref 150–400)
RBC: 3.47 MIL/uL — ABNORMAL LOW (ref 3.87–5.11)
RDW: 12.9 % (ref 11.5–15.5)
WBC: 5.7 10*3/uL (ref 4.0–10.5)
nRBC: 0 % (ref 0.0–0.2)

## 2021-10-10 LAB — BASIC METABOLIC PANEL
Anion gap: 7 (ref 5–15)
BUN: 12 mg/dL (ref 8–23)
CO2: 26 mmol/L (ref 22–32)
Calcium: 9.2 mg/dL (ref 8.9–10.3)
Chloride: 111 mmol/L (ref 98–111)
Creatinine, Ser: 0.98 mg/dL (ref 0.44–1.00)
GFR, Estimated: 58 mL/min — ABNORMAL LOW (ref >60)
Glucose, Bld: 95 mg/dL (ref 70–99)
Potassium: 3.7 mmol/L (ref 3.5–5.1)
Sodium: 144 mmol/L (ref 135–145)

## 2021-10-10 MED ORDER — FUROSEMIDE 10 MG/ML IJ SOLN
40.0000 mg | Freq: Once | INTRAMUSCULAR | Status: AC
  Administered 2021-10-10: 40 mg via INTRAVENOUS
  Filled 2021-10-10: qty 4

## 2021-10-10 MED ORDER — APIXABAN 5 MG PO TABS: 5.0000 mg | ORAL_TABLET | Freq: Two times a day (BID) | ORAL | Status: AC

## 2021-10-10 MED ORDER — TORSEMIDE 20 MG PO TABS: 10.0000 mg | ORAL_TABLET | Freq: Two times a day (BID) | ORAL | Status: DC

## 2021-10-10 MED ORDER — LOSARTAN POTASSIUM 25 MG PO TABS
25.0000 mg | ORAL_TABLET | Freq: Every day | ORAL | Status: DC
Start: 2021-10-10 — End: 2021-10-10
  Administered 2021-10-10: 25 mg via ORAL
  Filled 2021-10-10: qty 1

## 2021-10-10 MED ORDER — ACETAMINOPHEN 650 MG RE SUPP: 650.0000 mg | Freq: Four times a day (QID) | RECTAL | Status: DC | PRN

## 2021-10-10 MED ORDER — PSYLLIUM 95 % PO PACK: 1.0000 | PACK | Freq: Every day | ORAL | Status: AC

## 2021-10-10 MED ORDER — ACETAMINOPHEN 325 MG PO TABS: 650.0000 mg | ORAL_TABLET | ORAL | Status: DC | PRN

## 2021-10-10 MED ORDER — POLYETHYLENE GLYCOL 3350 17 G PO PACK: 17.0000 g | PACK | Freq: Two times a day (BID) | ORAL | Status: AC

## 2021-10-10 MED ORDER — PANTOPRAZOLE SODIUM 40 MG PO TBEC
40.0000 mg | DELAYED_RELEASE_TABLET | Freq: Every day | ORAL | Status: DC
  Administered 2021-10-10: 40 mg via ORAL
  Filled 2021-10-10: qty 1

## 2021-10-10 MED ORDER — METOPROLOL TARTRATE 25 MG PO TABS
12.5000 mg | ORAL_TABLET | Freq: Every day | ORAL | Status: DC
  Administered 2021-10-10: 12.5 mg via ORAL
  Filled 2021-10-10: qty 1

## 2021-10-10 NOTE — Discharge Summary (Signed)
?Physician Discharge Summary ?  ?Patient: Jodi Stein MRN: [id number] DOB: 29-Aug-1940  ?Admit date:     10/08/2021  ?Discharge date: 10/10/2021  ?Discharge Physician: Jodi Stein  ? ?PCP: Jodi Hire, MD  ? ?Recommendations at discharge:  ? ?Follow-up with PCP in 1 week ?Follow-up with Dr. Haig Stein, gastroenterologist ,in 2 weeks ? ?Discharge Diagnoses: ?Principal Problem: ?  Rectal bleeding ?Active Problems: ?  Chronic constipation ?  Hypertension ?  Obesity (BMI 30-39.9) ?  A-fib ([place]) ?  CKD (chronic kidney disease) stage 3, GFR 30-59 ml/min (HCC) ? ?Resolved Problems: ?  * No resolved hospital problems. * ? ?Hospital Course: ? ?[name] is a 81 y.o. female history significant for obesity, diverticulosis, constipation, paroxysmal atrial fibrillation on Eliquis, chronic diastolic CHF, hypertension, GERD, who presented to the emergency room after 2 episodes of bright red rectal bleeding.  She has problems with constipation and has been straining at stool lately despite taking laxatives.  Recent colonoscopy about a year prior to admission showed diverticulosis and internal hemorrhoids. ?  ?She was admitted to the hospital for rectal bleeding, likely from diverticular bleed.  She was treated with IV fluids.  She developed acute blood loss anemia but she did not require any blood transfusion.  Rectal bleeding resolved and she did not have any bleeding during hospitalization.   ? ?She complained of swelling in her hands, legs and abdomen and also complained of feeling bloated.  She was concerned about fluid overload because her ring felt tight on her finger.  She did not have any wheezing or crackles on lung exam and overt peripheral edema was not apparent on physical exam.  Given history of chronic diastolic CHF on torsemide, she was given IV Lasix prior to discharge.  She had adequate diuresis and felt much better.  Her condition has improved and she is deemed stable for discharge to home  today. ? ? ? ? ? ?  ? ? ?Consultants: Gastroenterologist ?Procedures performed: None ?Disposition: Home ?Diet recommendation:  ?Discharge Diet Orders (From admission, onward)  ? ?  Start     Ordered  ? 10/10/21 0000  Diet - low sodium heart healthy       ? 10/10/21 1308  ? ?  ?  ? ?  ? ?Cardiac diet ?DISCHARGE MEDICATION: ?Allergies as of 10/10/2021   ? ?   Reactions  ? Codeine Nausea And Vomiting  ? Tramadol Nausea And Vomiting  ? ?  ? ?  ?Medication List  ?  ? ?STOP taking these medications   ? ?furosemide 20 MG tablet ?Commonly known as: LASIX ?  ?lactulose 10 GM/15ML solution ?Commonly known as: [place] ?  ?lidocaine 5 % ?Commonly known as: Lidoderm ?  ?lisinopril 20 MG tablet ?Commonly known as: ZESTRIL ?  ?naproxen 500 MG EC tablet ?Commonly known as: EC NAPROSYN ?  ?omeprazole 20 MG capsule ?Commonly known as: PRILOSEC ?  ? ?  ? ?TAKE these medications   ? ?apixaban 5 MG Tabs tablet ?Commonly known as: ELIQUIS ?Take 1 tablet (5 mg total) by mouth 2 (two) times daily. ?Start taking on: October 12, 2021 ?What changed: These instructions start on October 12, 2021. If you are unsure what to do until then, ask your doctor or other care provider. ?  ?atorvastatin 40 MG tablet ?Commonly known as: LIPITOR ?Take 40 mg by mouth daily. ?What changed: Another medication with the same name was removed. Continue taking this medication, and follow the directions you see  here. ?  ?Cholecalciferol 25 MCG (1000 UT) tablet ?Take 1,000 Units by mouth daily. ?  ?famotidine 20 MG tablet ?Commonly known as: PEPCID ?Take 20 mg by mouth 2 (two) times daily. ?  ?gabapentin 100 MG capsule ?Commonly known as: NEURONTIN ?Take 100 mg by mouth 3 (three) times daily. ?  ?losartan 25 MG tablet ?Commonly known as: COZAAR ?Take 25 mg by mouth daily. ?  ?metoprolol tartrate 25 MG tablet ?Commonly known as: LOPRESSOR ?Take 12.5 mg by mouth daily. ?What changed: Another medication with the same name was removed. Continue taking this medication, and  follow the directions you see here. ?  ?pantoprazole 40 MG tablet ?Commonly known as: PROTONIX ?Take 40 mg by mouth daily. ?  ?polyethylene glycol 17 g packet ?Commonly known as: MIRALAX / GLYCOLAX ?Take 17 g by mouth 2 (two) times daily. Mix in 4-8oz fluid ?What changed: when to take this ?  ?psyllium 95 % Pack ?Commonly known as: HYDROCIL/METAMUCIL ?Take 1 packet by mouth daily. ?Start taking on: October 11, 2021 ?  ?sucralfate 1 g tablet ?Commonly known as: CARAFATE ?Take 1 g by mouth in the morning, at noon, and at bedtime. ?  ?torsemide 10 MG tablet ?Commonly known as: DEMADEX ?Take 10 mg by mouth 2 (two) times daily. ?  ? ?  ? ? Follow-up Information   ? ? Lesly Rubenstein, MD. Schedule an appointment as soon as possible for a visit in 2 week(s).   ?Specialty: Gastroenterology ?Contact information: ?[place] [address] ?[phone] ? ? ?  ?  ? ?  ?  ? ?  ? ?Discharge Exam: ?Filed Weights  ? 10/08/21 1101  ?Weight: 83.9 kg  ? ?GEN: NAD ?SKIN: No rash ?EYES: EOMI ?ENT: MMM ?CV: RRR ?PULM: CTA B ?ABD: soft, obese, NT, +BS ?CNS: AAO x 3, non focal ?EXT: No edema or tenderness ? ? ?Condition at discharge: good ? ?The results of significant diagnostics from this hospitalization (including imaging, microbiology, ancillary and laboratory) are listed below for reference.  ? ?Imaging Studies: ?No results found. ? ?Microbiology: ?Results for orders placed or performed during the hospital encounter of 09/16/20  ?SARS CORONAVIRUS 2 (TAT 6-24 HRS) Nasopharyngeal Nasopharyngeal Swab     Status: None  ? Collection Time: 09/16/20  9:26 AM  ? Specimen: Nasopharyngeal Swab  ?Result Value Ref Range Status  ? SARS Coronavirus 2 NEGATIVE NEGATIVE Final  ?  Comment: (NOTE) ?SARS-CoV-2 target nucleic acids are NOT DETECTED. ? ?The SARS-CoV-2 RNA is generally detectable in upper and lower ?respiratory specimens during the acute phase of infection. Negative ?results do not preclude SARS-CoV-2 infection, do not  rule out ?co-infections with other pathogens, and should not be used as the ?sole basis for treatment or other patient management decisions. ?Negative results must be combined with clinical observations, ?patient history, and epidemiological information. The expected ?result is Negative. ? ?Fact Sheet for Patients: ?SugarRoll.be ? ?Fact Sheet for Healthcare Providers: ?https://www.woods-mathews.com/ ? ?This test is not yet approved or cleared by the [place] FDA and  ?has been authorized for detection and/or diagnosis of SARS-CoV-2 by ?FDA under an Emergency Use Authorization (EUA). This EUA will remain  ?in effect (meaning this test can be used) for the duration of the ?COVID-19 declaration under Se ction 564(b)(1) of the Act, 21 U.S.C. ?section 360bbb-3(b)(1), unless the authorization is terminated or ?revoked sooner. ? ?Performed at [facility], [place] [address], [place], [place] ?[address] ?  ? ? ?Labs: ?CBC: ?Recent  Labs  ?Lab 10/08/21 ?1121 10/08/21 ?1955 10/09/21 ?0140 10/10/21 ?0428  ?WBC 7.3  --   --  5.7  ?NEUTROABS  --   --   --  3.0  ?HGB 12.0 10.8* 10.2* 10.5*  ?HCT 37.6 33.7* 32.0* 32.9*  ?MCV 94.0  --   --  94.8  ?PLT 255  --   --  224  ? ?Basic Metabolic Panel: ?Recent Labs  ?Lab 10/08/21 ?1121 10/09/21 ?0140 10/10/21 ?0428  ?NA 139 139 144  ?K 3.1* 3.5 3.7  ?CL 98 105 111  ?CO2 32 27 26  ?GLUCOSE 89 92 95  ?BUN '23 17 12  '$ ?CREATININE 1.21* 1.08* 0.98  ?CALCIUM 10.0 8.8* 9.2  ? ?Liver Function Tests: ?Recent Labs  ?Lab 10/08/21 ?1121  ?AST 28  ?ALT 32  ?ALKPHOS 64  ?BILITOT 0.7  ?PROT 7.7  ?ALBUMIN 4.5  ? ?CBG: ?No results for input(s): GLUCAP in the last 168 hours. ? ?Discharge time spent: greater than 30 minutes. ? ?Signed: ?Jodi Boroughs, MD ?[facility] ?10/10/2021 ?

## 2021-10-10 NOTE — Progress Notes (Signed)
GI Inpatient Follow-up Note ? ?Subjective: ? ?Patient seen and doing well. Had small bowel movement with no blood. ? ?Scheduled Inpatient Medications:  ? atorvastatin  40 mg Oral Daily  ? furosemide  40 mg Intravenous Once  ? losartan  25 mg Oral Daily  ? metoprolol tartrate  12.5 mg Oral Daily  ? pantoprazole  40 mg Oral Daily  ? polyethylene glycol  17 g Oral BID  ? psyllium  1 packet Oral Daily  ? torsemide  10 mg Oral BID  ? ? ?Continuous Inpatient Infusions: ?  ? ?PRN Inpatient Medications:  ?acetaminophen **OR** acetaminophen, ondansetron **OR** ondansetron (ZOFRAN) IV ? ?Review of Systems: ? ?Review of Systems  ?Constitutional:  Negative for chills and fever.  ?Cardiovascular:  Negative for chest pain.  ?Gastrointestinal:  Negative for blood in stool and melena.  ?Musculoskeletal:  Positive for joint pain.  ?Skin:  Negative for rash.  ?Neurological:  Negative for focal weakness.  ?Psychiatric/Behavioral:  Negative for substance abuse.   ?All other systems reviewed and are negative. ? ?  ?Physical Examination: ?BP 121/66 (BP Location: Right Arm)   Pulse 88   Temp (!) 97.5 ?F (36.4 ?C) (Oral)   Resp 16   Ht '5\' 1"'$  (1.549 m)   Wt 83.9 kg   SpO2 99%   BMI 34.96 kg/m?  ?Gen: NAD, alert and oriented x 4 ?HEENT: PEERLA, EOMI, ?Neck: supple, no JVD or thyromegaly ?Chest: No respiratory distress ?CV: RRR ?Abd: soft, non-tender, non-distended ?Ext: no edema, well perfused with 2+ pulses, ?Skin: no rash or lesions noted ?Lymph: no LAD ? ?Data: ?Lab Results  ?Component Value Date  ? WBC 5.7 10/10/2021  ? HGB 10.5 (L) 10/10/2021  ? HCT 32.9 (L) 10/10/2021  ? MCV 94.8 10/10/2021  ? PLT 224 10/10/2021  ? ?Recent Labs  ?Lab 10/08/21 ?1955 10/09/21 ?0140 10/10/21 ?0428  ?HGB 10.8* 10.2* 10.5*  ? ?Lab Results  ?Component Value Date  ? NA 144 10/10/2021  ? K 3.7 10/10/2021  ? CL 111 10/10/2021  ? CO2 26 10/10/2021  ? BUN 12 10/10/2021  ? CREATININE 0.98 10/10/2021  ? ?Lab Results  ?Component Value Date  ? ALT 32  10/08/2021  ? AST 28 10/08/2021  ? ALKPHOS 64 10/08/2021  ? BILITOT 0.7 10/08/2021  ? ?Recent Labs  ?Lab 10/08/21 ?1121  ?INR 1.1  ? ?Assessment/Plan: ?Ms. Trefz is a 81 y/o lady with constipation, known diverticulosis, and a.fib on DOAC who presents with bright red blood per rectum. This is likely a diverticular bleed given normal colonoscopy (besides diverticulosis and hemorrhoids) one year ago. No further bleeding. ? ?Recommendations: ? ?- can restart DOAC in two days ?- advanced diet as tolerated ?- no GI interventions needed at this point so will need to follow-up as an outpatient to address constipation issues ?- at discharge, will need to be on miralax BID and psylium ?  ?We will sign-off, please call with any further questions or concerns ? ?Raylene Miyamoto MD, MPH ?[facility]  ? ?  ?

## 2021-10-10 NOTE — TOC Transition Note (Signed)
Transition of Care (TOC) - CM/SW Discharge Note ? ? ?Patient Details  ?Name: Jodi Stein ?MRN: [id number] ?Date of Birth: [birth year] ? ?Transition of Care (TOC) CM/SW Contact:  ?Beverly Sessions, RN ?Phone Number: ?10/10/2021, 1:55 PM ? ? ?Clinical Narrative:    ? ? ?Patient will DC to: Home with home health.  Jodi Stein with [facility] ? ?Anticipated DC date: 10/10/21 ?Transport by: Son in law to transport  ? ?TOC signing off. ? ?Jodi Stein RNCM ?[phone] ? ?  ?Barriers to Discharge: Continued Medical Work up ? ? ?Patient Goals and CMS Choice ?Patient states their goals for this hospitalization and ongoing recovery are:: to return home, requesting home health ?CMS Medicare.gov Compare Post Acute Care list provided to:: Patient ?Choice offered to / list presented to : Patient ? ?Discharge Placement ?  ?           ?  ?  ?  ?  ? ?Discharge Plan and Services ?  ?  ?           ?  ?  ?  ?  ?  ?HH Arranged: PT, OT ?[facility] [place] Agency: [facility] ?Date HH Agency Contacted: 10/09/21 ?  ?Representative spoke with at [place]: Jodi Stein ? ?Social Determinants of Health (SDOH) Interventions ?  ? ? ?Readmission Risk Interventions ?No flowsheet data found. ? ? ? ? ?

## 2021-10-10 NOTE — Progress Notes (Signed)
Pt discharged per MD order. IV removed. Discharge instructions reviewed with pt. Pt verbalized understanding. Pt taken downstairs in wheelchair by staff.  ?

## 2021-10-12 ENCOUNTER — Ambulatory Visit: Admission: RE | Admit: 2021-10-12 | Payer: Medicare Other | Source: Ambulatory Visit

## 2021-10-25 ENCOUNTER — Other Ambulatory Visit: Payer: Self-pay | Admitting: Internal Medicine

## 2021-10-25 DIAGNOSIS — Z1231 Encounter for screening mammogram for malignant neoplasm of breast: Secondary | ICD-10-CM

## 2021-11-07 ENCOUNTER — Ambulatory Visit
Admission: RE | Admit: 2021-11-07 | Discharge: 2021-11-07 | Disposition: A | Discharge status: Home / Self Care | Payer: Medicare Other | Source: Ambulatory Visit | Attending: Physical Medicine & Rehabilitation | Admitting: Physical Medicine & Rehabilitation

## 2021-11-07 DIAGNOSIS — M48062 Spinal stenosis, lumbar region with neurogenic claudication: Secondary | ICD-10-CM | POA: Insufficient documentation

## 2021-12-01 ENCOUNTER — Ambulatory Visit
Admission: RE | Admit: 2021-12-01 | Discharge: 2021-12-01 | Disposition: A | Discharge status: Home / Self Care | Payer: Medicare Other | Source: Ambulatory Visit | Attending: Internal Medicine | Admitting: Internal Medicine

## 2021-12-01 DIAGNOSIS — Z1231 Encounter for screening mammogram for malignant neoplasm of breast: Secondary | ICD-10-CM | POA: Insufficient documentation

## 2022-05-22 ENCOUNTER — Encounter (INDEPENDENT_AMBULATORY_CARE_PROVIDER_SITE_OTHER): Payer: Self-pay

## 2022-10-21 IMAGING — MG MM DIGITAL SCREENING BILAT W/ TOMO AND CAD
8 series · 8 of 24 positions shown · non-contrast
Comparison: Previous exam(s).

CLINICAL DATA: Screening.

EXAM:
DIGITAL SCREENING BILATERAL MAMMOGRAM WITH TOMOSYNTHESIS AND CAD
TECHNIQUE: Bilateral screening digital craniocaudal and mediolateral oblique
mammograms were obtained. Bilateral screening digital breast
tomosynthesis was performed. The images were evaluated with
computer-aided detection.

[R CC synth-2D]
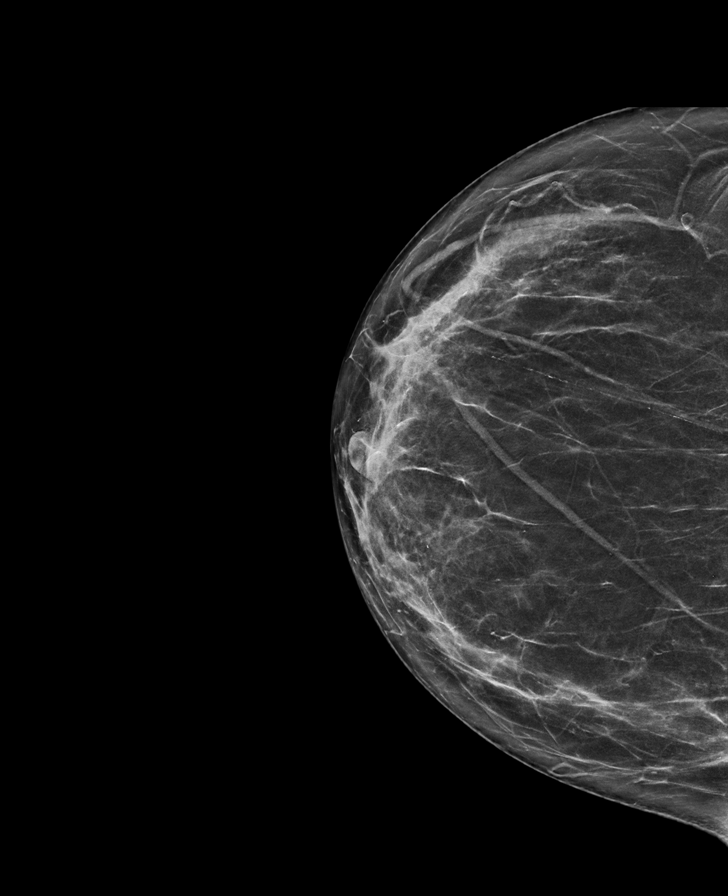

[L MLO synth-2D]
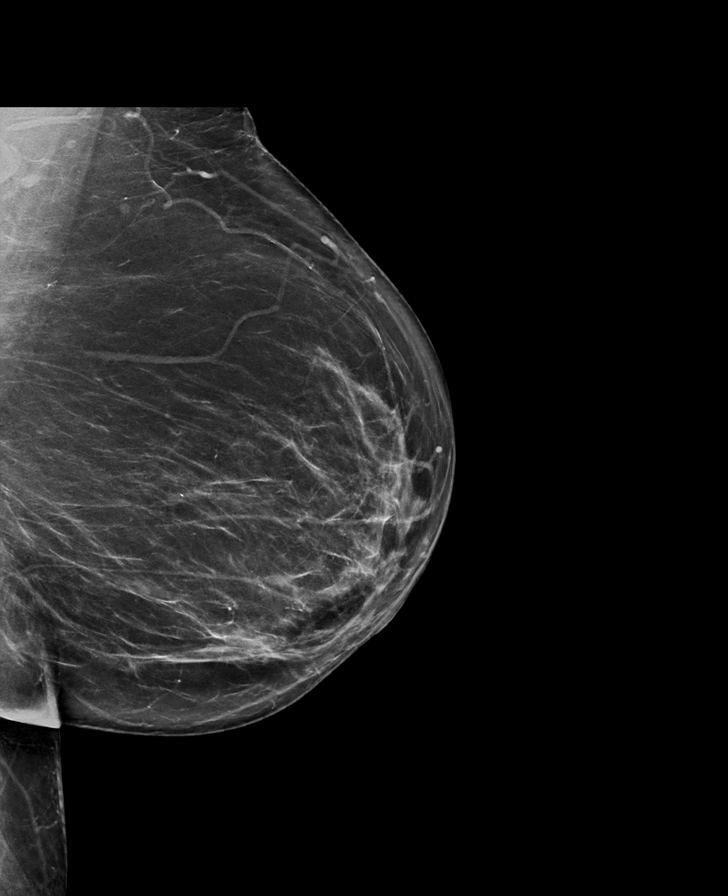

[R MLO synth-2D]
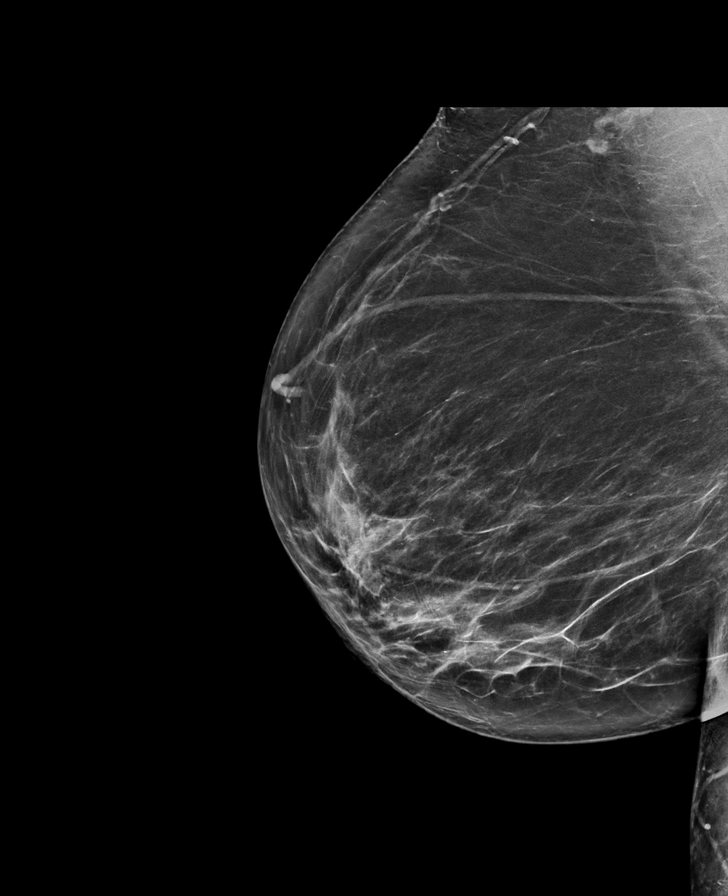

[L CC synth-2D]
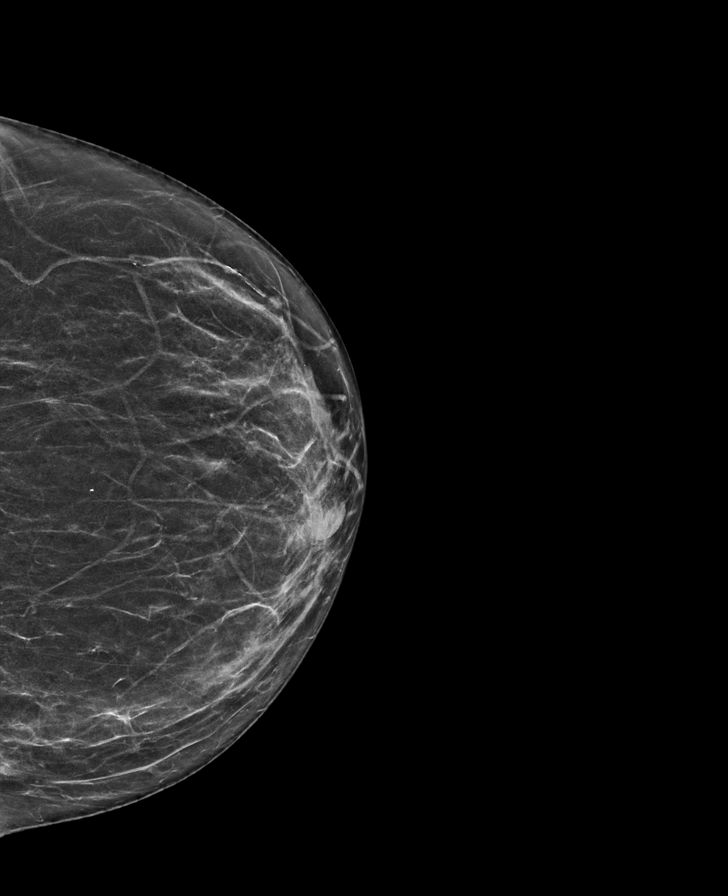

[L CC tomo · tomo slice 33/65.0]
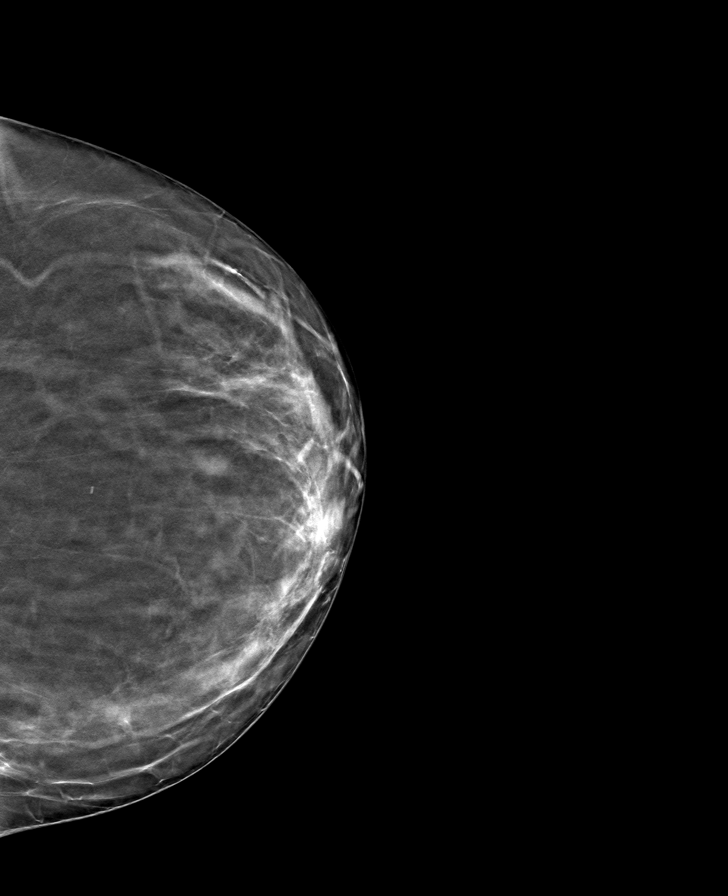

[R CC tomo · tomo slice 35/70.0]
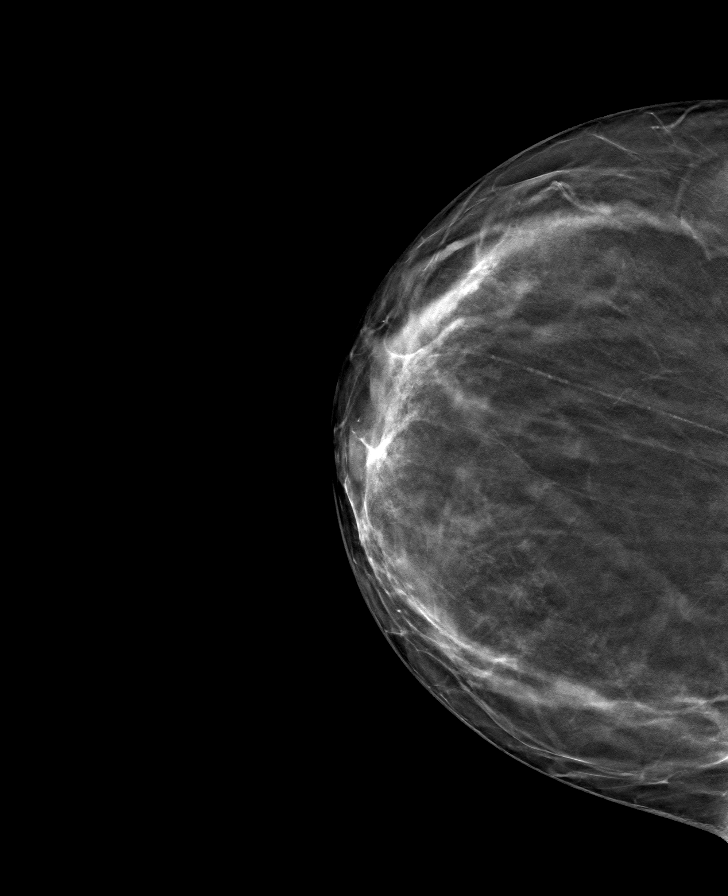

[R MLO tomo · tomo slice 41/81.0]
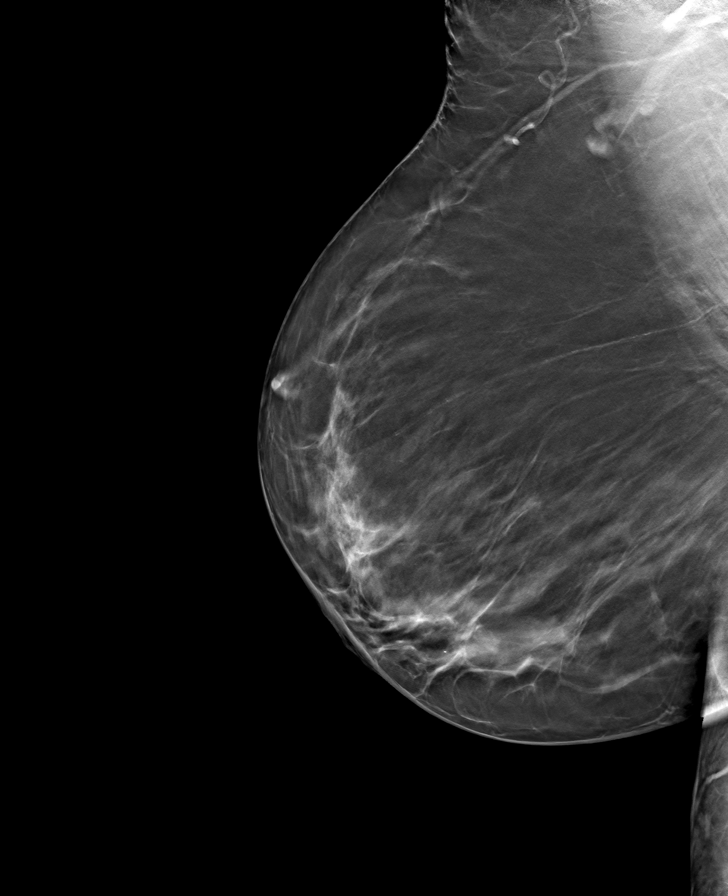

[L MLO tomo · tomo slice 41/82.0]
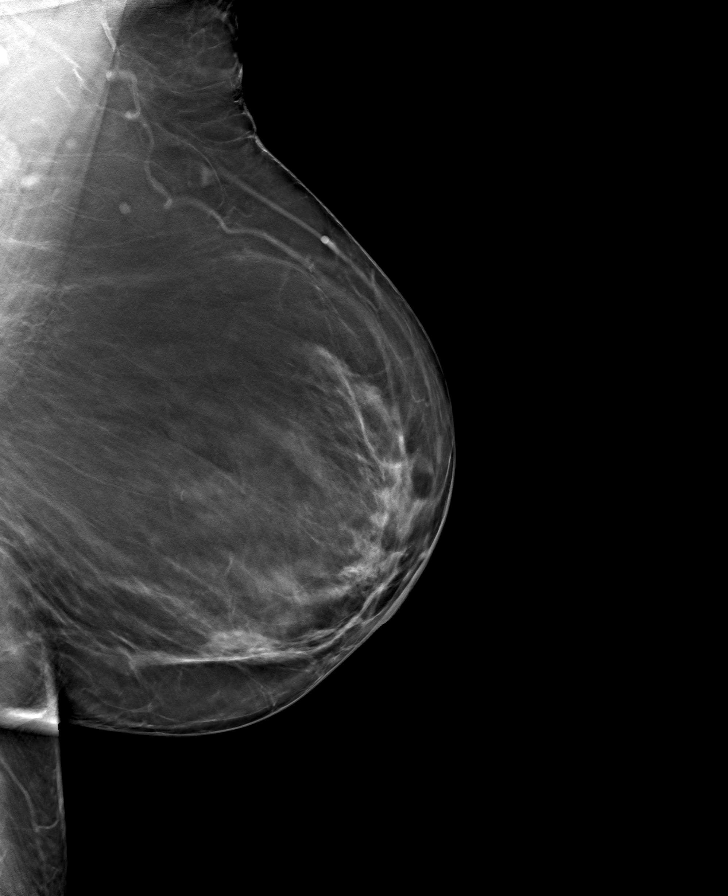

[8 of 24 positions shown; findings below may reference images not displayed]

ACR Breast Density Category b: There are scattered areas of
fibroglandular density.
FINDINGS: There are no findings suspicious for malignancy.
IMPRESSION: No mammographic evidence of malignancy. A result letter of this
screening mammogram will be mailed directly to the patient.

RECOMMENDATION:
Screening mammogram in one year. (Code:51-O-LD2)

BI-RADS CATEGORY  1: Negative.

## 2022-11-03 ENCOUNTER — Other Ambulatory Visit: Payer: Self-pay | Admitting: Internal Medicine

## 2022-11-03 DIAGNOSIS — Z1231 Encounter for screening mammogram for malignant neoplasm of breast: Secondary | ICD-10-CM

## 2022-11-20 ENCOUNTER — Other Ambulatory Visit: Payer: Self-pay | Admitting: Internal Medicine

## 2022-11-20 DIAGNOSIS — R0602 Shortness of breath: Secondary | ICD-10-CM

## 2022-11-20 DIAGNOSIS — I2089 Other forms of angina pectoris: Secondary | ICD-10-CM

## 2022-12-12 ENCOUNTER — Ambulatory Visit
Admission: RE | Admit: 2022-12-12 | Discharge: 2022-12-12 | Disposition: A | Discharge status: Home / Self Care | Payer: Medicare Other | Source: Ambulatory Visit | Attending: Internal Medicine | Admitting: Internal Medicine

## 2022-12-12 DIAGNOSIS — Z1231 Encounter for screening mammogram for malignant neoplasm of breast: Secondary | ICD-10-CM | POA: Diagnosis not present

## 2022-12-22 ENCOUNTER — Other Ambulatory Visit (HOSPITAL_COMMUNITY): Payer: Self-pay | Admitting: *Deleted

## 2022-12-22 ENCOUNTER — Telehealth (HOSPITAL_COMMUNITY): Payer: Self-pay | Admitting: *Deleted

## 2022-12-22 ENCOUNTER — Other Ambulatory Visit: Payer: Self-pay

## 2022-12-22 MED ORDER — IVABRADINE HCL 5 MG PO TABS
ORAL_TABLET | ORAL | 0 refills | Status: DC
Start: 2022-12-22 — End: 2023-01-20
  Filled 2022-12-22: qty 2, 1d supply, fill #0

## 2022-12-22 NOTE — Telephone Encounter (Signed)
Reaching out to patient to offer assistance regarding upcoming cardiac imaging study; pt verbalizes understanding of appt date/time. In reviewing instructions, patient wanted to reschedule to have more time to prepare. New appointment made for July 1 at 10:15am.    Patient confirms a HR of 102 bpm and is taking 12.5mg  metoprolol daily.  Corlanor sent to [facility] in preparation for patient's new appointment. Letter to be mailed out with instructions. Patient verbalized understanding.  Larey Brick RN Navigator Cardiac Imaging [facility] [phone] office [phone] cell

## 2022-12-25 ENCOUNTER — Ambulatory Visit: Admission: RE | Admit: 2022-12-25 | Payer: Medicare Other | Source: Ambulatory Visit

## 2022-12-28 ENCOUNTER — Other Ambulatory Visit: Payer: Self-pay

## 2023-01-17 ENCOUNTER — Other Ambulatory Visit: Payer: Self-pay

## 2023-01-17 ENCOUNTER — Emergency Department: Payer: Medicare Other

## 2023-01-17 DIAGNOSIS — R739 Hyperglycemia, unspecified: Secondary | ICD-10-CM | POA: Diagnosis present

## 2023-01-17 DIAGNOSIS — Z90710 Acquired absence of both cervix and uterus: Secondary | ICD-10-CM

## 2023-01-17 DIAGNOSIS — Z885 Allergy status to narcotic agent status: Secondary | ICD-10-CM

## 2023-01-17 DIAGNOSIS — Z7901 Long term (current) use of anticoagulants: Secondary | ICD-10-CM

## 2023-01-17 DIAGNOSIS — N1831 Chronic kidney disease, stage 3a: Secondary | ICD-10-CM | POA: Diagnosis present

## 2023-01-17 DIAGNOSIS — K5900 Constipation, unspecified: Secondary | ICD-10-CM | POA: Diagnosis present

## 2023-01-17 DIAGNOSIS — E860 Dehydration: Secondary | ICD-10-CM | POA: Diagnosis present

## 2023-01-17 DIAGNOSIS — I700 Atherosclerosis of aorta: Secondary | ICD-10-CM | POA: Diagnosis present

## 2023-01-17 DIAGNOSIS — I482 Chronic atrial fibrillation, unspecified: Secondary | ICD-10-CM | POA: Diagnosis present

## 2023-01-17 DIAGNOSIS — A419 Sepsis, unspecified organism: Secondary | ICD-10-CM | POA: Diagnosis not present

## 2023-01-17 DIAGNOSIS — Z9049 Acquired absence of other specified parts of digestive tract: Secondary | ICD-10-CM

## 2023-01-17 DIAGNOSIS — Z87891 Personal history of nicotine dependence: Secondary | ICD-10-CM

## 2023-01-17 DIAGNOSIS — Z6836 Body mass index (BMI) 36.0-36.9, adult: Secondary | ICD-10-CM

## 2023-01-17 DIAGNOSIS — B3781 Candidal esophagitis: Secondary | ICD-10-CM | POA: Diagnosis present

## 2023-01-17 DIAGNOSIS — J039 Acute tonsillitis, unspecified: Secondary | ICD-10-CM | POA: Diagnosis not present

## 2023-01-17 DIAGNOSIS — E785 Hyperlipidemia, unspecified: Secondary | ICD-10-CM | POA: Diagnosis present

## 2023-01-17 DIAGNOSIS — N179 Acute kidney failure, unspecified: Secondary | ICD-10-CM | POA: Diagnosis present

## 2023-01-17 DIAGNOSIS — J351 Hypertrophy of tonsils: Secondary | ICD-10-CM | POA: Diagnosis present

## 2023-01-17 DIAGNOSIS — K2100 Gastro-esophageal reflux disease with esophagitis, without bleeding: Secondary | ICD-10-CM | POA: Diagnosis present

## 2023-01-17 DIAGNOSIS — Z79899 Other long term (current) drug therapy: Secondary | ICD-10-CM

## 2023-01-17 DIAGNOSIS — Z803 Family history of malignant neoplasm of breast: Secondary | ICD-10-CM

## 2023-01-17 DIAGNOSIS — E669 Obesity, unspecified: Secondary | ICD-10-CM | POA: Diagnosis present

## 2023-01-17 DIAGNOSIS — I130 Hypertensive heart and chronic kidney disease with heart failure and stage 1 through stage 4 chronic kidney disease, or unspecified chronic kidney disease: Secondary | ICD-10-CM | POA: Diagnosis present

## 2023-01-17 DIAGNOSIS — K760 Fatty (change of) liver, not elsewhere classified: Secondary | ICD-10-CM | POA: Diagnosis present

## 2023-01-17 DIAGNOSIS — J392 Other diseases of pharynx: Secondary | ICD-10-CM | POA: Diagnosis present

## 2023-01-17 DIAGNOSIS — I5032 Chronic diastolic (congestive) heart failure: Secondary | ICD-10-CM | POA: Diagnosis present

## 2023-01-17 LAB — CBC WITH DIFFERENTIAL/PLATELET
Abs Immature Granulocytes: 0.09 10*3/uL — ABNORMAL HIGH (ref 0.00–0.07)
Basophils Absolute: 0.1 10*3/uL (ref 0.0–0.1)
Basophils Relative: 0 %
Eosinophils Absolute: 0.1 10*3/uL (ref 0.0–0.5)
Eosinophils Relative: 1 %
HCT: 37.9 % (ref 36.0–46.0)
Hemoglobin: 11.9 g/dL — ABNORMAL LOW (ref 12.0–15.0)
Immature Granulocytes: 1 %
Lymphocytes Relative: 14 %
Lymphs Abs: 2.5 10*3/uL (ref 0.7–4.0)
MCH: 31 pg (ref 26.0–34.0)
MCHC: 31.4 g/dL (ref 30.0–36.0)
MCV: 98.7 fL (ref 80.0–100.0)
Monocytes Absolute: 1.1 10*3/uL — ABNORMAL HIGH (ref 0.1–1.0)
Monocytes Relative: 7 %
Neutro Abs: 13.7 10*3/uL — ABNORMAL HIGH (ref 1.7–7.7)
Neutrophils Relative %: 77 %
Platelets: 270 10*3/uL (ref 150–400)
RBC: 3.84 MIL/uL — ABNORMAL LOW (ref 3.87–5.11)
RDW: 14 % (ref 11.5–15.5)
WBC: 17.5 10*3/uL — ABNORMAL HIGH (ref 4.0–10.5)
nRBC: 0 % (ref 0.0–0.2)

## 2023-01-17 LAB — COMPREHENSIVE METABOLIC PANEL
ALT: 32 U/L (ref 0–44)
AST: 22 U/L (ref 15–41)
Albumin: 4.3 g/dL (ref 3.5–5.0)
Alkaline Phosphatase: 72 U/L (ref 38–126)
Anion gap: 10 (ref 5–15)
BUN: 15 mg/dL (ref 8–23)
CO2: 26 mmol/L (ref 22–32)
Calcium: 9.6 mg/dL (ref 8.9–10.3)
Chloride: 103 mmol/L (ref 98–111)
Creatinine, Ser: 1.13 mg/dL — ABNORMAL HIGH (ref 0.44–1.00)
GFR, Estimated: 49 mL/min — ABNORMAL LOW (ref >60)
Glucose, Bld: 120 mg/dL — ABNORMAL HIGH (ref 70–99)
Potassium: 4 mmol/L (ref 3.5–5.1)
Sodium: 139 mmol/L (ref 135–145)
Total Bilirubin: 1.4 mg/dL — ABNORMAL HIGH (ref 0.3–1.2)
Total Protein: 7.4 g/dL (ref 6.5–8.1)

## 2023-01-17 LAB — LACTIC ACID, PLASMA: Lactic Acid, Venous: 1.4 mmol/L (ref 0.5–1.9)

## 2023-01-17 NOTE — ED Triage Notes (Addendum)
Pt to ED via POV c/o coughing up thick mucus. Pt was seen at [facility] clinic for same and was told she had a sinus infection. Pt was prescribed antibiotics but has been having trouble swallowing it because its hurting her throat. Denies any CP, fevers, SOB

## 2023-01-18 ENCOUNTER — Encounter: Payer: Self-pay | Admitting: Anesthesiology

## 2023-01-18 ENCOUNTER — Inpatient Hospital Stay
Admission: EM | Admit: 2023-01-18 | Discharge: 2023-01-20 | DRG: 872 | Disposition: A | Discharge status: Home / Self Care | Payer: Medicare Other | Attending: Internal Medicine | Admitting: Internal Medicine

## 2023-01-18 ENCOUNTER — Encounter
Admission: EM | Disposition: A | Discharge status: Home / Self Care | Payer: Self-pay | Source: Home / Self Care | Attending: Internal Medicine

## 2023-01-18 ENCOUNTER — Emergency Department: Payer: Medicare Other

## 2023-01-18 DIAGNOSIS — J351 Hypertrophy of tonsils: Secondary | ICD-10-CM | POA: Diagnosis present

## 2023-01-18 DIAGNOSIS — Z6836 Body mass index (BMI) 36.0-36.9, adult: Secondary | ICD-10-CM | POA: Diagnosis not present

## 2023-01-18 DIAGNOSIS — Z79899 Other long term (current) drug therapy: Secondary | ICD-10-CM | POA: Diagnosis not present

## 2023-01-18 DIAGNOSIS — K2100 Gastro-esophageal reflux disease with esophagitis, without bleeding: Secondary | ICD-10-CM | POA: Diagnosis present

## 2023-01-18 DIAGNOSIS — I10 Essential (primary) hypertension: Secondary | ICD-10-CM

## 2023-01-18 DIAGNOSIS — E785 Hyperlipidemia, unspecified: Secondary | ICD-10-CM | POA: Diagnosis present

## 2023-01-18 DIAGNOSIS — I700 Atherosclerosis of aorta: Secondary | ICD-10-CM | POA: Diagnosis present

## 2023-01-18 DIAGNOSIS — K5900 Constipation, unspecified: Secondary | ICD-10-CM | POA: Diagnosis present

## 2023-01-18 DIAGNOSIS — Z90710 Acquired absence of both cervix and uterus: Secondary | ICD-10-CM | POA: Diagnosis not present

## 2023-01-18 DIAGNOSIS — J392 Other diseases of pharynx: Secondary | ICD-10-CM | POA: Diagnosis present

## 2023-01-18 DIAGNOSIS — Z803 Family history of malignant neoplasm of breast: Secondary | ICD-10-CM | POA: Diagnosis not present

## 2023-01-18 DIAGNOSIS — R739 Hyperglycemia, unspecified: Secondary | ICD-10-CM | POA: Diagnosis present

## 2023-01-18 DIAGNOSIS — I482 Chronic atrial fibrillation, unspecified: Secondary | ICD-10-CM

## 2023-01-18 DIAGNOSIS — Z885 Allergy status to narcotic agent status: Secondary | ICD-10-CM | POA: Diagnosis not present

## 2023-01-18 DIAGNOSIS — I480 Paroxysmal atrial fibrillation: Secondary | ICD-10-CM | POA: Diagnosis not present

## 2023-01-18 DIAGNOSIS — N1831 Chronic kidney disease, stage 3a: Secondary | ICD-10-CM

## 2023-01-18 DIAGNOSIS — I5032 Chronic diastolic (congestive) heart failure: Secondary | ICD-10-CM

## 2023-01-18 DIAGNOSIS — K209 Esophagitis, unspecified without bleeding: Secondary | ICD-10-CM | POA: Diagnosis not present

## 2023-01-18 DIAGNOSIS — N179 Acute kidney failure, unspecified: Secondary | ICD-10-CM

## 2023-01-18 DIAGNOSIS — K219 Gastro-esophageal reflux disease without esophagitis: Secondary | ICD-10-CM | POA: Diagnosis present

## 2023-01-18 DIAGNOSIS — I130 Hypertensive heart and chronic kidney disease with heart failure and stage 1 through stage 4 chronic kidney disease, or unspecified chronic kidney disease: Secondary | ICD-10-CM | POA: Diagnosis present

## 2023-01-18 DIAGNOSIS — A419 Sepsis, unspecified organism: Secondary | ICD-10-CM | POA: Diagnosis present

## 2023-01-18 DIAGNOSIS — E860 Dehydration: Secondary | ICD-10-CM

## 2023-01-18 DIAGNOSIS — K760 Fatty (change of) liver, not elsewhere classified: Secondary | ICD-10-CM | POA: Diagnosis present

## 2023-01-18 DIAGNOSIS — J039 Acute tonsillitis, unspecified: Secondary | ICD-10-CM

## 2023-01-18 DIAGNOSIS — Z9049 Acquired absence of other specified parts of digestive tract: Secondary | ICD-10-CM | POA: Diagnosis not present

## 2023-01-18 DIAGNOSIS — Z87891 Personal history of nicotine dependence: Secondary | ICD-10-CM | POA: Diagnosis not present

## 2023-01-18 DIAGNOSIS — Z7901 Long term (current) use of anticoagulants: Secondary | ICD-10-CM | POA: Diagnosis not present

## 2023-01-18 DIAGNOSIS — E119 Type 2 diabetes mellitus without complications: Secondary | ICD-10-CM

## 2023-01-18 DIAGNOSIS — B3781 Candidal esophagitis: Secondary | ICD-10-CM | POA: Diagnosis present

## 2023-01-18 DIAGNOSIS — E782 Mixed hyperlipidemia: Secondary | ICD-10-CM | POA: Diagnosis not present

## 2023-01-18 DIAGNOSIS — E669 Obesity, unspecified: Secondary | ICD-10-CM | POA: Diagnosis present

## 2023-01-18 LAB — APTT: aPTT: 36 seconds (ref 24–36)

## 2023-01-18 LAB — BRAIN NATRIURETIC PEPTIDE: B Natriuretic Peptide: 22.7 pg/mL (ref 0.0–100.0)

## 2023-01-18 LAB — PROTIME-INR
INR: 1.1 (ref 0.8–1.2)
Prothrombin Time: 14.5 seconds (ref 11.4–15.2)

## 2023-01-18 LAB — PROCALCITONIN: Procalcitonin: <0.1 ng/mL

## 2023-01-18 SURGERY — ESOPHAGOGASTRODUODENOSCOPY (EGD) WITH PROPOFOL
Anesthesia: General

## 2023-01-18 MED ORDER — DEXAMETHASONE SODIUM PHOSPHATE 10 MG/ML IJ SOLN
10.0000 mg | Freq: Three times a day (TID) | INTRAMUSCULAR | Status: DC
  Administered 2023-01-18 – 2023-01-20 (×6): 10 mg via INTRAVENOUS
  Filled 2023-01-18 (×6): qty 1

## 2023-01-18 MED ORDER — HYDRALAZINE HCL 20 MG/ML IJ SOLN: 5.0000 mg | INTRAMUSCULAR | Status: DC | PRN

## 2023-01-18 MED ORDER — APIXABAN 5 MG PO TABS
5.0000 mg | ORAL_TABLET | Freq: Two times a day (BID) | ORAL | Status: DC
  Administered 2023-01-18 – 2023-01-20 (×4): 5 mg via ORAL
  Filled 2023-01-18 (×4): qty 1

## 2023-01-18 MED ORDER — GABAPENTIN 100 MG PO CAPS
100.0000 mg | ORAL_CAPSULE | Freq: Three times a day (TID) | ORAL | Status: DC
  Administered 2023-01-18 – 2023-01-20 (×6): 100 mg via ORAL
  Filled 2023-01-18 (×6): qty 1

## 2023-01-18 MED ORDER — FLUCONAZOLE IN SODIUM CHLORIDE 400-0.9 MG/200ML-% IV SOLN
400.0000 mg | Freq: Once | INTRAVENOUS | Status: AC
  Administered 2023-01-18: 400 mg via INTRAVENOUS
  Filled 2023-01-18: qty 200

## 2023-01-18 MED ORDER — FLUTICASONE PROPIONATE 50 MCG/ACT NA SUSP
1.0000 | Freq: Every day | NASAL | Status: DC
  Filled 2023-01-18: qty 16

## 2023-01-18 MED ORDER — FAMOTIDINE IN NACL 20-0.9 MG/50ML-% IV SOLN
20.0000 mg | Freq: Once | INTRAVENOUS | Status: AC
  Administered 2023-01-18: 20 mg via INTRAVENOUS
  Filled 2023-01-18: qty 50

## 2023-01-18 MED ORDER — ONDANSETRON HCL 4 MG/2ML IJ SOLN: 4.0000 mg | Freq: Three times a day (TID) | INTRAMUSCULAR | Status: DC | PRN

## 2023-01-18 MED ORDER — SODIUM CHLORIDE 0.9 % IV SOLN: INTRAVENOUS | Status: DC

## 2023-01-18 MED ORDER — SODIUM CHLORIDE 0.9 % IV BOLUS (SEPSIS): 1000.0000 mL | Freq: Once | INTRAVENOUS | Status: DC

## 2023-01-18 MED ORDER — SODIUM CHLORIDE 0.9 % IV SOLN: 2.0000 g | INTRAVENOUS | Status: DC

## 2023-01-18 MED ORDER — OXYCODONE-ACETAMINOPHEN 5-325 MG PO TABS: 1.0000 | ORAL_TABLET | ORAL | Status: DC | PRN

## 2023-01-18 MED ORDER — LIDOCAINE VISCOUS HCL 2 % MT SOLN
15.0000 mL | Freq: Once | OROMUCOSAL | Status: AC
  Administered 2023-01-18: 15 mL via OROMUCOSAL
  Filled 2023-01-18: qty 15

## 2023-01-18 MED ORDER — SENNOSIDES-DOCUSATE SODIUM 8.6-50 MG PO TABS
1.0000 | ORAL_TABLET | Freq: Two times a day (BID) | ORAL | Status: DC
  Administered 2023-01-18 – 2023-01-20 (×5): 1 via ORAL
  Filled 2023-01-18 (×5): qty 1

## 2023-01-18 MED ORDER — MAGIC MOUTHWASH
10.0000 mL | Freq: Once | ORAL | Status: AC
  Administered 2023-01-18: 10 mL via ORAL
  Filled 2023-01-18: qty 10

## 2023-01-18 MED ORDER — VITAMIN D 25 MCG (1000 UNIT) PO TABS
1000.0000 [IU] | ORAL_TABLET | Freq: Every day | ORAL | Status: DC
  Administered 2023-01-18 – 2023-01-20 (×3): 1000 [IU] via ORAL
  Filled 2023-01-18 (×3): qty 1

## 2023-01-18 MED ORDER — FENTANYL CITRATE PF 50 MCG/ML IJ SOSY: 12.5000 ug | PREFILLED_SYRINGE | INTRAMUSCULAR | Status: DC | PRN

## 2023-01-18 MED ORDER — IOHEXOL 300 MG/ML  SOLN
75.0000 mL | Freq: Once | INTRAMUSCULAR | Status: AC | PRN
  Administered 2023-01-18: 75 mL via INTRAVENOUS

## 2023-01-18 MED ORDER — PANTOPRAZOLE SODIUM 40 MG IV SOLR
40.0000 mg | Freq: Two times a day (BID) | INTRAVENOUS | Status: DC
  Administered 2023-01-18 – 2023-01-20 (×5): 40 mg via INTRAVENOUS
  Filled 2023-01-18 (×5): qty 10

## 2023-01-18 MED ORDER — ATORVASTATIN CALCIUM 20 MG PO TABS
40.0000 mg | ORAL_TABLET | Freq: Every day | ORAL | Status: DC
  Administered 2023-01-18 – 2023-01-20 (×3): 40 mg via ORAL
  Filled 2023-01-18 (×3): qty 2

## 2023-01-18 MED ORDER — SODIUM CHLORIDE 0.9 % IV SOLN
500.0000 mg | INTRAVENOUS | Status: DC
  Administered 2023-01-18: 500 mg via INTRAVENOUS
  Filled 2023-01-18: qty 5

## 2023-01-18 MED ORDER — PSYLLIUM 95 % PO PACK
1.0000 | PACK | Freq: Every day | ORAL | Status: DC
  Administered 2023-01-19 – 2023-01-20 (×2): 1 via ORAL
  Filled 2023-01-18 (×4): qty 1

## 2023-01-18 MED ORDER — SODIUM CHLORIDE 0.9 % IV SOLN
3.0000 g | Freq: Four times a day (QID) | INTRAVENOUS | Status: DC
  Administered 2023-01-18 – 2023-01-20 (×9): 3 g via INTRAVENOUS
  Filled 2023-01-18 (×10): qty 8

## 2023-01-18 MED ORDER — ACETAMINOPHEN 160 MG/5ML PO SOLN: 650.0000 mg | Freq: Four times a day (QID) | ORAL | Status: DC | PRN

## 2023-01-18 MED ORDER — FLUCONAZOLE IN SODIUM CHLORIDE 200-0.9 MG/100ML-% IV SOLN
200.0000 mg | INTRAVENOUS | Status: DC
  Administered 2023-01-19 – 2023-01-20 (×2): 200 mg via INTRAVENOUS
  Filled 2023-01-18 (×2): qty 100

## 2023-01-18 MED ORDER — METOPROLOL TARTRATE 25 MG PO TABS
12.5000 mg | ORAL_TABLET | Freq: Every day | ORAL | Status: DC
  Administered 2023-01-18 – 2023-01-20 (×3): 12.5 mg via ORAL
  Filled 2023-01-18 (×3): qty 1

## 2023-01-18 MED ORDER — SODIUM CHLORIDE 0.9 % IV BOLUS
1000.0000 mL | Freq: Once | INTRAVENOUS | Status: AC
  Administered 2023-01-18: 1000 mL via INTRAVENOUS

## 2023-01-18 MED ORDER — DEXAMETHASONE SODIUM PHOSPHATE 10 MG/ML IJ SOLN
10.0000 mg | Freq: Once | INTRAMUSCULAR | Status: AC
  Administered 2023-01-18: 10 mg via INTRAVENOUS
  Filled 2023-01-18: qty 1

## 2023-01-18 MED ORDER — POLYETHYLENE GLYCOL 3350 17 G PO PACK
17.0000 g | PACK | Freq: Every day | ORAL | Status: DC | PRN
  Administered 2023-01-18 – 2023-01-20 (×2): 17 g via ORAL
  Filled 2023-01-18 (×2): qty 1

## 2023-01-18 MED ORDER — ACETAMINOPHEN 650 MG RE SUPP: 650.0000 mg | Freq: Four times a day (QID) | RECTAL | Status: DC | PRN

## 2023-01-18 MED ORDER — SODIUM CHLORIDE 0.9 % IV BOLUS (SEPSIS)
1000.0000 mL | Freq: Once | INTRAVENOUS | Status: AC
  Administered 2023-01-18: 1000 mL via INTRAVENOUS

## 2023-01-18 MED ORDER — CLOTRIMAZOLE 10 MG MT TROC
10.0000 mg | Freq: Every day | OROMUCOSAL | Status: DC
  Administered 2023-01-18 – 2023-01-20 (×9): 10 mg via ORAL
  Filled 2023-01-18 (×15): qty 1

## 2023-01-18 MED ORDER — SODIUM CHLORIDE 0.9 % IV BOLUS (SEPSIS)
500.0000 mL | Freq: Once | INTRAVENOUS | Status: AC
  Administered 2023-01-18: 500 mL via INTRAVENOUS

## 2023-01-18 MED ORDER — PHENOL 1.4 % MT LIQD
1.0000 | OROMUCOSAL | Status: DC | PRN
  Administered 2023-01-18: 1 via OROMUCOSAL
  Filled 2023-01-18: qty 177

## 2023-01-18 MED ORDER — METOPROLOL TARTRATE 5 MG/5ML IV SOLN: 5.0000 mg | INTRAVENOUS | Status: DC | PRN

## 2023-01-18 MED ORDER — AMPICILLIN-SULBACTAM SODIUM 3 (2-1) G IJ SOLR
3.0000 g | Freq: Once | INTRAMUSCULAR | Status: AC
  Administered 2023-01-18: 3 g via INTRAVENOUS
  Filled 2023-01-18: qty 8

## 2023-01-18 NOTE — Progress Notes (Signed)
Pharmacy Antibiotic Note  Jodi Stein is a 82 y.o. female admitted on 01/18/2023 with tonsillitis/ ?Esophageal candidasis. Pharmacy has been consulted for Unasyn and Fluconazole dosing.  Plan: -Unasyn 3 gm IV q6h     -Fluconazole 400 mg IV x 1 dose then Fluconazole 200 mg IV q24h    Crcl 41.3 ml/min  F/u renal fxn and cultures   Height: 5\' 2"  (157.5 cm) Weight: 95.3 kg (210 lb) IBW/kg (Calculated) : 50.1  Temp (24hrs), Avg:98.8 F (37.1 C), Min:97.5 F (36.4 C), Max:100.4 F (38 C)  Recent Labs  Lab 01/17/23 2202  WBC 17.5*  CREATININE 1.13*  LATICACIDVEN 1.4    Estimated Creatinine Clearance: 41.3 mL/min (A) (by C-G formula based on SCr of 1.13 mg/dL (H)).    Allergies  Allergen Reactions   Codeine Nausea And Vomiting   Tramadol Nausea And Vomiting    Antimicrobials this admission: Unasyn 6/27>> Azithromycin 6/27>> Fluconazole 6/27>>  Dose adjustments this admission:    Microbiology results: 6/27 BCx: pend   UCx:      Sputum:      MRSA PCR:    CT of neck soft tissue showed possible tonsillitis/pharyngitis, no abscess.  CT of chest showed esophagitis   Thank you for allowing pharmacy to be a part of this patient's care.  Zaid Tomes A 01/18/2023 8:11 AM

## 2023-01-18 NOTE — ED Notes (Signed)
Dr. Clyde Lundborg notified pt's HR still 115-125bpm, and that fluid bolus are still being infused.

## 2023-01-18 NOTE — ED Notes (Signed)
Lights dimmed per pt request

## 2023-01-18 NOTE — ED Provider Notes (Signed)
[facility] Provider Note    Event Date/Time   First MD Initiated Contact with Patient 01/18/23 0118     (approximate)   History   Nasal Congestion   HPI  Jodi Stein is a 82 y.o. female who presents to the ED from home with a chief complaint of sore throat and difficulty swallowing.  Patient was seen at [facility] yesterday for same, diagnosed with sinus infection and started on Z-Pak.  Patient unable to take her medications due to difficulty and painful swallowing.  Endorses fever, chills, sore throat, productive cough.  Denies chest pain, shortness of breath, abdominal pain, nausea, vomiting or diarrhea.     Past Medical History   Past Medical History:  Diagnosis Date   A-fib [facility])    Abdominal pain 2013   Arthritis    Bladder wall thickening    Constipation    Diverticulosis    Family history of adverse reaction to anesthesia    GERD (gastroesophageal reflux disease)    Hyperlipidemia    Hypertension    Neuroendocrine tumor    rectal   Obesity (BMI 30-39.9)    Steatosis of liver      Active Problem List   Patient Active Problem List   Diagnosis Date Noted   Rectal bleeding 10/08/2021   CKD (chronic kidney disease) stage 3, GFR 30-59 ml/min (HCC)    Swelling of limb 12/16/2019   Pain in limb 12/16/2019   Neuropathy 09/17/2019   A-fib (HCC) 08/22/2019   Lower extremity edema 08/08/2019   Chronic left shoulder pain 10/29/2017   Chronic pain of right knee 10/29/2017   Chronic pain of both shoulders 05/01/2017   Rectal carcinoid tumor    Neuroendocrine tumor 02/08/2016   Obesity (BMI 30-39.9) 06/25/2015   Steatosis of liver 06/25/2015   Chronic constipation 12/16/2014   Closed fracture of phalanx of right second toe 07/02/2014   Bladder wall thickening 05/01/2013   GERD (gastroesophageal reflux disease) 09/26/2012   Hyperlipidemia 09/26/2012   Hypertension 09/26/2012   Ventral hernia 09/15/2012     Past Surgical History    Past Surgical History:  Procedure Laterality Date   ABDOMINAL HYSTERECTOMY     CHOLECYSTECTOMY     COLONOSCOPY N/A 09/20/2020   Procedure: COLONOSCOPY;  Surgeon: Toledo, Boykin Nearing, MD;  Location: [facility] ENDOSCOPY;  Service: Gastroenterology;  Laterality: N/A;   COLONOSCOPY WITH PROPOFOL N/A 02/08/2016   Procedure: COLONOSCOPY WITH PROPOFOL;  Surgeon: Christena Deem, MD;  Location: [facility] ENDOSCOPY;  Service: Endoscopy;  Laterality: N/A;   ESOPHAGOGASTRODUODENOSCOPY (EGD) WITH PROPOFOL N/A 09/20/2020   Procedure: ESOPHAGOGASTRODUODENOSCOPY (EGD) WITH PROPOFOL;  Surgeon: Toledo, Boykin Nearing, MD;  Location: [facility] ENDOSCOPY;  Service: Gastroenterology;  Laterality: N/A;   EUS N/A 08/24/2016   Procedure: LOWER ENDOSCOPIC ULTRASOUND (EUS);  Surgeon: Rachael Fee, MD;  Location: [facility] ENDOSCOPY;  Service: Endoscopy;  Laterality: N/A;   EYE SURGERY     FLEXIBLE SIGMOIDOSCOPY N/A 08/08/2016   Procedure: FLEXIBLE SIGMOIDOSCOPY;  Surgeon: Christena Deem, MD;  Location: [facility] ENDOSCOPY;  Service: Endoscopy;  Laterality: N/A;   FLEXIBLE SIGMOIDOSCOPY N/A 04/04/2018   Procedure: FLEXIBLE SIGMOIDOSCOPY;  Surgeon: Christena Deem, MD;  Location: [facility] ENDOSCOPY;  Service: Endoscopy;  Laterality: N/A;   HERNIA REPAIR  2013   umbilical   HERNIA REPAIR  09/27/2012   ventral hernia   ROTATOR CUFF REPAIR Left      Home Medications   Prior to Admission medications   Medication Sig Start Date End  Date Taking? Authorizing Provider  apixaban (ELIQUIS) 5 MG TABS tablet Take 1 tablet (5 mg total) by mouth 2 (two) times daily. 10/12/21   Lurene Shadow, MD  atorvastatin (LIPITOR) 40 MG tablet Take 40 mg by mouth daily. 12/17/19   [provider]  Cholecalciferol 25 MCG (1000 UT) tablet Take 1,000 Units by mouth daily. 11/07/19   [provider]  famotidine (PEPCID) 20 MG tablet Take 20 mg by mouth 2 (two) times daily.    [provider]  gabapentin (NEURONTIN) 100 MG capsule Take 100  mg by mouth 3 (three) times daily. 11/21/19   [provider]  ivabradine (CORLANOR) 5 MG TABS tablet Take 2 tablets (10mg ) by mouth TWO hours prior to your cardiac CT Scan. 12/22/22   Debbe Odea, MD  losartan (COZAAR) 25 MG tablet Take 25 mg by mouth daily.    [provider]  metoprolol tartrate (LOPRESSOR) 25 MG tablet Take 12.5 mg by mouth daily.    [provider]  pantoprazole (PROTONIX) 40 MG tablet Take 40 mg by mouth daily.    [provider]  polyethylene glycol (MIRALAX / GLYCOLAX) 17 g packet Take 17 g by mouth 2 (two) times daily. Mix in 4-8oz fluid 10/10/21   Lurene Shadow, MD  psyllium (HYDROCIL/METAMUCIL) 95 % PACK Take 1 packet by mouth daily. 10/11/21   Lurene Shadow, MD  sucralfate (CARAFATE) 1 g tablet Take 1 g by mouth in the morning, at noon, and at bedtime.    [provider]  torsemide (DEMADEX) 10 MG tablet Take 10 mg by mouth 2 (two) times daily.    [provider]     Allergies  Codeine and Tramadol   Family History   Family History  Problem Relation Age of Onset   Breast cancer Sister      Physical Exam  Triage Vital Signs: ED Triage Vitals  Enc Vitals Group     BP 01/17/23 2157 137/83     Pulse Rate 01/17/23 2157 (!) 120     Resp 01/17/23 2157 20     Temp 01/17/23 2157 (!) 100.4 F (38 C)     Temp Source 01/17/23 2157 Oral     SpO2 01/17/23 2157 97 %     Weight --      Height --      Head Circumference --      Peak Flow --      Pain Score 01/17/23 2156 0     Pain Loc --      Pain Edu? --      Excl. in GC? --     Updated Vital Signs: BP (!) 144/67   Pulse (!) 114   Temp 99 F (37.2 C) (Oral)   Resp 18   SpO2 97%    General: Awake, mild distress.  CV:  Tachycardic.  Good peripheral perfusion.  Resp:  Normal effort.  Diminished bibasilarly, otherwise CTAB. Abd:  Nontender.  No distention.  Other:  Posterior oropharynx erythematous with tonsillar exudates, no peritonsillar  abscess.  Mildly hoarse voice.  Difficulty tolerating secretions as it is too painful to swallow so she spits up her secretions.  Shotty anterior cervical lymphadenopathy bilaterally.   ED Results / Procedures / Treatments  Labs (all labs ordered are listed, but only abnormal results are displayed) Labs Reviewed  CBC WITH DIFFERENTIAL/PLATELET - Abnormal; Notable for the following components:      Result Value   WBC 17.5 (*)    RBC  3.84 (*)    Hemoglobin 11.9 (*)    Neutro Abs 13.7 (*)    Monocytes Absolute 1.1 (*)    Abs Immature Granulocytes 0.09 (*)    All other components within normal limits  COMPREHENSIVE METABOLIC PANEL - Abnormal; Notable for the following components:   Glucose, Bld 120 (*)    Creatinine, Ser 1.13 (*)    Total Bilirubin 1.4 (*)    GFR, Estimated 49 (*)    All other components within normal limits  CULTURE, BLOOD (ROUTINE X 2)  CULTURE, BLOOD (ROUTINE X 2)  LACTIC ACID, PLASMA     EKG  ED ECG REPORT I, Camira Geidel J, the attending physician, personally viewed and interpreted this ECG.   Date: 01/18/2023  EKG Time: 2203  Rate: 124  Rhythm: sinus tachycardia  Axis: Normal  Intervals:none  ST&T Change: Nonspecific    RADIOLOGY I have independently visualized and interpreted patient CT scans as well as noted the radiology interpretation:  CT soft tissue neck: Pharyngitis/tonsillitis  CT chest: Esophagitis  Chest x-ray: No acute cardiopulmonary process  Official radiology report(s): CT CHEST W CONTRAST  Result Date: 01/18/2023 CLINICAL DATA:  82 year old female with productive cough, started on antibiotics for sinus infection. Throat pain, painful swallowing. EXAM: CT CHEST WITH CONTRAST TECHNIQUE: Multidetector CT imaging of the chest was performed during intravenous contrast administration. RADIATION DOSE REDUCTION: This exam was performed according to the departmental dose-optimization program which includes automated exposure control,  adjustment of the mA and/or kV according to patient size and/or use of iterative reconstruction technique. CONTRAST:  75mL OMNIPAQUE IOHEXOL 300 MG/ML SOLN in conjunction with contrast enhanced imaging of the neck reported separately. COMPARISON:  Neck CT today reported separately. Chest radiographs yesterday. Prior chest CT 03/10/2016. FINDINGS: Cardiovascular: Calcified coronary artery and Calcified aortic atherosclerosis. Cardiac size is at the upper limits of normal to mildly enlarged. No pericardial effusion. Unremarkable other central mediastinal vascular structures. Mediastinum/Nodes: Generalized, circumferential esophageal wall thickening up to 8 mm (series 3, image 38) beginning at the thoracic inlet and continuing distally. Tapering of the esophageal wall at either a small gastric hiatal hernia or chronic phrenic ampulla. No significant mediastinal inflammatory stranding. No mediastinal gas, fluid, or lymphadenopathy. Lungs/Pleura: Mildly lower lung volumes compared to 2017. Major airways remain patent. Mediastinal lipomatosis affecting both middle lobes. Mild right middle lobe atelectasis. Linear platelike atelectasis and/or scarring in the lower lobes is not significantly changed compared to 2017. No consolidation or pleural effusion. A small 3 mm left lung nodule on series 4, image 59 is stable since 2017 and benign (no follow-up imaging recommended). Upper Abdomen: Chronic cholecystectomy and hepatic steatosis. Visible liver, spleen, pancreas, adrenal glands, and left kidney appears stable since 2017. Decompressed stomach. Diverticulosis of the transverse colon. No upper abdominal free air or free fluid. Musculoskeletal: Chronically exaggerated upper thoracic kyphosis. No acute osseous abnormality identified. IMPRESSION: 1. Acute Esophagitis; abnormal circumferential wall thickening throughout the thoracic esophagus. Consider Candida Esophagitis in this age group, or might be related to suspected URI  (Neck CT reported separately). 2. Negative for pneumonia, and no other acute finding in the Chest. 3.  Aortic Atherosclerosis (ICD10-I70.0).  Hepatic steatosis. Electronically Signed   By: Odessa Fleming M.D.   On: 01/18/2023 04:27   CT Soft Tissue Neck W Contrast  Result Date: 01/18/2023 CLINICAL DATA:  82 year old female with productive cough, started on antibiotics for sinus infection. Throat pain, painful swallowing. EXAM: CT NECK WITH CONTRAST TECHNIQUE: Multidetector CT imaging of the neck was performed  using the standard protocol following the bolus administration of intravenous contrast. RADIATION DOSE REDUCTION: This exam was performed according to the departmental dose-optimization program which includes automated exposure control, adjustment of the mA and/or kV according to patient size and/or use of iterative reconstruction technique. CONTRAST:  75mL OMNIPAQUE IOHEXOL 300 MG/ML  SOLN COMPARISON:  Chest CT today reported separately. Chest radiographs yesterday. FINDINGS: Pharynx and larynx: Pharynx motion artifact including at the epiglottis. At the glottis level laryngeal contours are normal. Epiglottis probably remains normal on series 3, image 47. The soft palate appears edematous including the uvula (sagittal image 39). And there is mild generalized lingual tonsil, palatine tonsil and adenoid hypertrophy. No tonsillar hyperenhancement or separation identified. Parapharyngeal and retropharyngeal spaces remain normal. Salivary glands: Negative when allowing for mild motion artifact. Thyroid: Negative. Lymph nodes: Nonenlarged bilateral level 2 lymph nodes may be mildly hyperenhancing (such as on the right series 8, image 38). But there are no abnormally enlarged, cystic or necrotic lymph nodes. Vascular: Major vascular structures in the neck and at the skull base are patent. Left carotid bifurcation calcified atherosclerosis. Limited intracranial: Negative; Calcified atherosclerosis at the skull base.  Visualized orbits: Minimally included. Mastoids and visualized paranasal sinuses: Visualized paranasal sinuses and mastoids are clear. Tympanic cavities appear clear. Skeleton: Mandible motion artifact. Age-appropriate cervical spine degeneration. No acute osseous abnormality identified. Upper chest: Dedicated chest CT is reported separately today. IMPRESSION: 1. Mild motion artifact. 2. Soft palate and uvula appear edematous, along with generalized mild pharyngeal tonsillar hypertrophy. Favor URI with combined Pharyngitis/Tonsillitis. Angioedema felt less likely. No abscess or other complicating features. 3. Chest CT reported separately. Electronically Signed   By: Odessa Fleming M.D.   On: 01/18/2023 04:21   DG Chest 2 View  Result Date: 01/17/2023 CLINICAL DATA:  Productive cough, difficulty swallowing EXAM: CHEST - 2 VIEW COMPARISON:  None Available. FINDINGS: The heart size and mediastinal contours are within normal limits. Both lungs are clear. The visualized skeletal structures are unremarkable. IMPRESSION: No active cardiopulmonary disease. Electronically Signed   By: Sharlet Salina M.D.   On: 01/17/2023 22:54     PROCEDURES:  Critical Care performed: Yes, see critical care procedure note(s)  CRITICAL CARE Performed by: Irean Hong   Total critical care time: 30 minutes  Critical care time was exclusive of separately billable procedures and treating other patients.  Critical care was necessary to treat or prevent imminent or life-threatening deterioration.  Critical care was time spent personally by me on the following activities: development of treatment plan with patient and/or surrogate as well as nursing, discussions with consultants, evaluation of patient's response to treatment, examination of patient, obtaining history from patient or surrogate, ordering and performing treatments and interventions, ordering and review of laboratory studies, ordering and review of radiographic studies,  pulse oximetry and re-evaluation of patient's condition.   Marland Kitchen1-3 Lead EKG Interpretation  Performed by: Irean Hong, MD Authorized by: Irean Hong, MD     Interpretation: abnormal     ECG rate:  120   ECG rate assessment: tachycardic     Rhythm: sinus tachycardia     Ectopy: none     Conduction: normal   Comments:     Patient placed on cardiac monitor to evaluate for arrhythmias    MEDICATIONS ORDERED IN ED: Medications  famotidine (PEPCID) IVPB 20 mg premix (has no administration in time range)  sodium chloride 0.9 % bolus 1,000 mL (1,000 mLs Intravenous New Bag/Given 01/18/23 0248)  dexamethasone (DECADRON) injection  10 mg (10 mg Intravenous Given 01/18/23 0250)  Ampicillin-Sulbactam (UNASYN) 3 g in sodium chloride 0.9 % 100 mL IVPB (0 g Intravenous Stopped 01/18/23 0333)  magic mouthwash (10 mLs Oral Given 01/18/23 0249)  lidocaine (XYLOCAINE) 2 % viscous mouth solution 15 mL (15 mLs Mouth/Throat Given 01/18/23 0250)  iohexol (OMNIPAQUE) 300 MG/ML solution 75 mL (75 mLs Intravenous Contrast Given 01/18/23 0400)     IMPRESSION / MDM / ASSESSMENT AND PLAN / ED COURSE  I reviewed the triage vital signs and the nursing notes.                             82 year old female presenting with sore throat, painful difficulty swallowing.  Differential diagnosis includes but is not limited to pharyngitis, tonsillitis, epiglottitis, retropharyngeal abscess, etc.  I personally reviewed patient's records and note her office visit from yesterday.  Patient's presentation is most consistent with acute presentation with potential threat to life or bodily function.  The patient is on the cardiac monitor to evaluate for evidence of arrhythmia and/or significant heart rate changes.  Will obtain lab work, chest x-ray, initiate IV fluid resuscitation, Decadron, Unasyn and Magic mouthwash.  Will reassess.  Clinical Course as of 01/18/23 0450  Thu Jan 18, 2023  0315 Patient remains tachycardic despite  improved temperature.  Leukocytosis with WBC 17.5.  Lactic acid is unremarkable.  Will obtain CT soft tissue neck and chest. [JS]  0441 Patient spitting in bag, unable to tolerate PO.  Updated patient and family member of CT scan results of tonsillitis, mild edema and esophagitis.  Will add IV Pepcid.  Will consult hospitalist services for evaluation and admission. [JS]    Clinical Course User Index [JS] Irean Hong, MD     FINAL CLINICAL IMPRESSION(S) / ED DIAGNOSES   Final diagnoses:  Esophagitis  Tonsillitis  Dehydration  AKI (acute kidney injury) (HCC)  Sepsis, due to unspecified organism, unspecified whether acute organ dysfunction present [facility])     Rx / DC Orders   ED Discharge Orders     None        Note:  This document was prepared using Dragon voice recognition software and may include unintentional dictation errors.   Irean Hong, MD 01/18/23 [id number]

## 2023-01-18 NOTE — ED Notes (Signed)
Patient transported to CT 

## 2023-01-18 NOTE — Consult Note (Signed)
Jodi Stein, Jodi Stein [id number] 12-11-1940 Lorretta Harp, MD  Reason for Consult: Sore throat  HPI: 82 year old female approximately 3-day history of severe sore throat.  Was seen in urgent care yesterday diagnosed with a pharyngitis and started on Z-Pak got progressively worse was brought back into the emergency room.  In the emergency room was noted by the ED physician to have tonsillar exudate and significant difficulty swallowing.  CT scan showed some swelling of the posterior pharynx as well as swelling of the esophagus.  During her emergency room stay she has been given Unasyn Decadron and Diflucan.  She tells me that her symptoms have improved since being on these medications.  Allergies:  Allergies  Allergen Reactions   Codeine Nausea And Vomiting   Tramadol Nausea And Vomiting    ROS: Review of systems normal other than 12 systems except per HPI.  PMH:  Past Medical History:  Diagnosis Date   A-fib [facility])    Abdominal pain 2013   Arthritis    Bladder wall thickening    Constipation    Diverticulosis    Family history of adverse reaction to anesthesia    GERD (gastroesophageal reflux disease)    Hyperlipidemia    Hypertension    Neuroendocrine tumor    rectal   Obesity (BMI 30-39.9)    Steatosis of liver     FH:  Family History  Problem Relation Age of Onset   Breast cancer Sister     SH:  Social History   Socioeconomic History   Marital status: Widowed    Spouse name: Not on file   Number of children: Not on file   Years of education: Not on file   Highest education level: Not on file  Occupational History   Not on file  Tobacco Use   Smoking status: Former    Packs/day: 0.25    Years: 2.00    Additional pack years: 0.00    Total pack years: 0.50    Types: Cigarettes    Quit date: 07/24/1961    Years since quitting: 61.5   Smokeless tobacco: Never  Vaping Use   Vaping Use: Never used  Substance and Sexual Activity   Alcohol use: No   Drug use: No    Sexual activity: Not on file  Other Topics Concern   Not on file  Social History Narrative   Not on file   Social Determinants of Health   Financial Resource Strain: Not on file  Food Insecurity: Not on file  Transportation Needs: Not on file  Physical Activity: Not on file  Stress: Not on file  Social Connections: Not on file  Intimate Partner Violence: Not on file    PSH:  Past Surgical History:  Procedure Laterality Date   ABDOMINAL HYSTERECTOMY     CHOLECYSTECTOMY     COLONOSCOPY N/A 09/20/2020   Procedure: COLONOSCOPY;  Surgeon: Toledo, Boykin Nearing, MD;  Location: [facility] ENDOSCOPY;  Service: Gastroenterology;  Laterality: N/A;   COLONOSCOPY WITH PROPOFOL N/A 02/08/2016   Procedure: COLONOSCOPY WITH PROPOFOL;  Surgeon: Christena Deem, MD;  Location: [facility] ENDOSCOPY;  Service: Endoscopy;  Laterality: N/A;   ESOPHAGOGASTRODUODENOSCOPY (EGD) WITH PROPOFOL N/A 09/20/2020   Procedure: ESOPHAGOGASTRODUODENOSCOPY (EGD) WITH PROPOFOL;  Surgeon: Toledo, Boykin Nearing, MD;  Location: [facility] ENDOSCOPY;  Service: Gastroenterology;  Laterality: N/A;   EUS N/A 08/24/2016   Procedure: LOWER ENDOSCOPIC ULTRASOUND (EUS);  Surgeon: Rachael Fee, MD;  Location: [facility] ENDOSCOPY;  Service: Endoscopy;  Laterality: N/A;   EYE SURGERY  FLEXIBLE SIGMOIDOSCOPY N/A 08/08/2016   Procedure: FLEXIBLE SIGMOIDOSCOPY;  Surgeon: Christena Deem, MD;  Location: [facility] ENDOSCOPY;  Service: Endoscopy;  Laterality: N/A;   FLEXIBLE SIGMOIDOSCOPY N/A 04/04/2018   Procedure: FLEXIBLE SIGMOIDOSCOPY;  Surgeon: Christena Deem, MD;  Location: [facility] ENDOSCOPY;  Service: Endoscopy;  Laterality: N/A;   HERNIA REPAIR  2013   umbilical   HERNIA REPAIR  09/27/2012   ventral hernia   ROTATOR CUFF REPAIR Left     Physical  Exam: Patient is awake and alert sitting up in the ER bed, no stridor no urgent distress.  The external ears appear normal the anterior nose is benign the oral cavity oropharynx shows some erythema of the  soft palate with some mild swelling of the uvula.  There is not appear to be significant tonsillar hypertrophy or exudate.  Palpation the neck was unremarkable.  Procedure: Flexible fiberoptic laryngoscopy-the patient's consent a topical anesthetic of phenylephrine lidocaine solution was placed within each nostril.  Approximately 2 cc was used.  A flexible fiberoptic laryngoscope was passed through the right nostril.  Examination nasopharynx was unremarkable.  There was mild to moderate swelling of the uvula.  Examination the larynx showed the epiglottis to be normal the vocal folds appeared normal.  There was mild to moderate edema of the mucosa overlying the arytenoids bilaterally.  The swelling also appeared to extend into the upper esophagus.   A/P: Pharyngitis/esophagitis-she has also been seen by Dr. Servando Snare and gastroenterology.  I have spoken with him.  I suspect this posterior pharyngeal swelling could likely represent thrush which would coincide with the esophageal issue as well.  I doubt this is angioedema however she is on losartan.  Agree with Dr. Servando Snare to continue the Diflucan would also recommend starting her on the oral Mycelex troches which she can suck on orally and swallow.  This would not only help the pharynx but also the esophagus.  There does not appear to be any airway obstruction at this point.  I would continue the Unasyn and Decadron until the swelling and pain have significantly decreased and her swallowing has improved.  Would recommend clear liquid diet or as per GI recommends.  She can follow-up with me as an outpatient.   Davina Poke 01/18/2023 12:51 PM

## 2023-01-18 NOTE — ED Notes (Signed)
Pharmacy contacted for missing med 

## 2023-01-18 NOTE — H&P (Signed)
History and Physical    Jodi Stein DOB: May 18, 1941 DOA: 01/18/2023  Referring MD/NP/PA:   PCP: Gracelyn Nurse, MD   Patient coming from:  The patient is coming from home.     Chief Complaint: Sore throat, difficulty swallowing, fever  HPI: Jodi Stein is a 82 y.o. female with medical history significant of HTN, HLD, dCHF, GERD, CKD-3a, A fib on Eliquis, steatosis of liver, rectal neuroendocrine tumor (carcinoid tumor), sore throat, difficulty swallowing, fever.   Pt states that she has sore throat for more than 3 days.  She has cough with thick mucus production.  She has nasal congestion.  No chest pain or shortness of breath.  She has difficulty swallowing due to pain.  She has nausea, no vomiting, diarrhea or abdominal pain.  Patient is constipated.  Denies symptoms of UTI. Pt was seen [facility] and was prescribed Z-Pak, but has been having trouble swallowing it because its hurting her throat.   Data reviewed independently and ED Course: pt was found to have WBC 17.5, lactic acid 1.4, procalcitonin<0.10, negative PCR for COVID, flu and RSV, renal function close to baseline, Strep A PCR negative. Temperature 100.4, blood pressure 159/91, heart rate 123, RR 20, oxygen saturation 93-97% on room air.  Chest x-ray negative. CT of neck soft tissue showed possible tonsillitis/pharyngitis, no abscess.  CT of chest showed esophagitis.  Patient is admitted to PCU as inpatient.  Dr. Servando Snare of GI and Dr. Jenne Campus of ENT are consulted.  CT-chest: 1. Acute Esophagitis; abnormal circumferential wall thickening throughout the thoracic esophagus. Consider Candida Esophagitis in this age group, or might be related to suspected URI (Neck CT reported separately). 2. Negative for pneumonia, and no other acute finding in the Chest. 3.  Aortic Atherosclerosis (ICD10-I  CT-neck soft tissue: 1. Mild motion artifact. 2. Soft palate and uvula appear edematous, along with generalized mild  pharyngeal tonsillar hypertrophy. Favor URI with combined Pharyngitis/Tonsillitis. Angioedema felt less likely. No abscess or other complicating features. 3. Chest CT reported separately.   EKG: I have personally reviewed.  Sinus rhythm, QTc 456, possible left atrial enlargement, borderline left axis deviation.   Review of Systems:   General: no fevers, chills, no body weight gain, has fatigue HEENT: no blurry vision, hearing changes. Has sore throat Respiratory: no dyspnea, has coughing, no wheezing CV: no chest pain, no palpitations GI: has nausea, no vomiting, abdominal pain, diarrhea,  has constipation GU: no dysuria, burning on urination, increased urinary frequency, hematuria  Ext: no leg edema Neuro: no unilateral weakness, numbness, or tingling, no vision change or hearing loss Skin: no rash, no skin tear. MSK: No muscle spasm, no deformity, no limitation of range of movement in spin Heme: No easy bruising.  Travel history: No recent long distant travel.   Allergy:  Allergies  Allergen Reactions   Codeine Nausea And Vomiting   Tramadol Nausea And Vomiting    Past Medical History:  Diagnosis Date   A-fib (HCC)    Abdominal pain 2013   Arthritis    Bladder wall thickening    Constipation    Diverticulosis    Family history of adverse reaction to anesthesia    GERD (gastroesophageal reflux disease)    Hyperlipidemia    Hypertension    Neuroendocrine tumor    rectal   Obesity (BMI 30-39.9)    Steatosis of liver     Past Surgical History:  Procedure Laterality Date   ABDOMINAL HYSTERECTOMY     CHOLECYSTECTOMY  COLONOSCOPY N/A 09/20/2020   Procedure: COLONOSCOPY;  Surgeon: Toledo, Boykin Nearing, MD;  Location: [facility] ENDOSCOPY;  Service: Gastroenterology;  Laterality: N/A;   COLONOSCOPY WITH PROPOFOL N/A 02/08/2016   Procedure: COLONOSCOPY WITH PROPOFOL;  Surgeon: Christena Deem, MD;  Location: [facility] ENDOSCOPY;  Service: Endoscopy;  Laterality: N/A;    ESOPHAGOGASTRODUODENOSCOPY (EGD) WITH PROPOFOL N/A 09/20/2020   Procedure: ESOPHAGOGASTRODUODENOSCOPY (EGD) WITH PROPOFOL;  Surgeon: Toledo, Boykin Nearing, MD;  Location: [facility] ENDOSCOPY;  Service: Gastroenterology;  Laterality: N/A;   EUS N/A 08/24/2016   Procedure: LOWER ENDOSCOPIC ULTRASOUND (EUS);  Surgeon: Rachael Fee, MD;  Location: [facility] ENDOSCOPY;  Service: Endoscopy;  Laterality: N/A;   EYE SURGERY     FLEXIBLE SIGMOIDOSCOPY N/A 08/08/2016   Procedure: FLEXIBLE SIGMOIDOSCOPY;  Surgeon: Christena Deem, MD;  Location: [facility] ENDOSCOPY;  Service: Endoscopy;  Laterality: N/A;   FLEXIBLE SIGMOIDOSCOPY N/A 04/04/2018   Procedure: FLEXIBLE SIGMOIDOSCOPY;  Surgeon: Christena Deem, MD;  Location: [facility] ENDOSCOPY;  Service: Endoscopy;  Laterality: N/A;   HERNIA REPAIR  2013   umbilical   HERNIA REPAIR  09/27/2012   ventral hernia   ROTATOR CUFF REPAIR Left     Social History:  reports that she quit smoking about 61 years ago. Her smoking use included cigarettes. She has a 0.50 pack-year smoking history. She has never used smokeless tobacco. She reports that she does not drink alcohol and does not use drugs.  Family History:  Family History  Problem Relation Age of Onset   Breast cancer Sister      Prior to Admission medications   Medication Sig Start Date End Date Taking? Authorizing Provider  apixaban (ELIQUIS) 5 MG TABS tablet Take 1 tablet (5 mg total) by mouth 2 (two) times daily. 10/12/21   Lurene Shadow, MD  atorvastatin (LIPITOR) 40 MG tablet Take 40 mg by mouth daily. 12/17/19   [provider]  Cholecalciferol 25 MCG (1000 UT) tablet Take 1,000 Units by mouth daily. 11/07/19   [provider]  famotidine (PEPCID) 20 MG tablet Take 20 mg by mouth 2 (two) times daily.    [provider]  gabapentin (NEURONTIN) 100 MG capsule Take 100 mg by mouth 3 (three) times daily. 11/21/19   [provider]  ivabradine (CORLANOR) 5 MG TABS tablet Take 2  tablets (10mg ) by mouth TWO hours prior to your cardiac CT Scan. 12/22/22   Debbe Odea, MD  losartan (COZAAR) 25 MG tablet Take 25 mg by mouth daily.    [provider]  metoprolol tartrate (LOPRESSOR) 25 MG tablet Take 12.5 mg by mouth daily.    [provider]  pantoprazole (PROTONIX) 40 MG tablet Take 40 mg by mouth daily.    [provider]  polyethylene glycol (MIRALAX / GLYCOLAX) 17 g packet Take 17 g by mouth 2 (two) times daily. Mix in 4-8oz fluid 10/10/21   Lurene Shadow, MD  psyllium (HYDROCIL/METAMUCIL) 95 % PACK Take 1 packet by mouth daily. 10/11/21   Lurene Shadow, MD  sucralfate (CARAFATE) 1 g tablet Take 1 g by mouth in the morning, at noon, and at bedtime.    [provider]  torsemide (DEMADEX) 10 MG tablet Take 10 mg by mouth 2 (two) times daily.    [provider]    Physical Exam: Vitals:   01/18/23 1230 01/18/23 1300 01/18/23 1330 01/18/23 1530  BP: (!) 107/23   (!) 126/92  Pulse: (!) 118 (!) 107 (!) 108 100  Resp: (!) 21 (!) 21 20 (!)  23  Temp:      TempSrc:      SpO2: 100% 97% 97% 98%  Weight:      Height:       General: Not in acute distress HEENT: Has erythema in posterior oropharynx, with little tonsillar exudate, does not seem to have peritonsillar abscess.  Patient has difficulty swallowing secretions       Eyes: PERRL, EOMI, no jaundice       ENT: No discharge from the ears and nose.       Neck: No JVD, no bruit, no mass felt. Heme: Has anterior cervical lymphadenopathy bilaterally. Cardiac: S1/S2, RRR, No murmurs, No gallops or rubs. Respiratory: No rales, wheezing, rhonchi or rubs. GI: Soft, nondistended, nontender, no rebound pain, no organomegaly, BS present. GU: No hematuria Ext: No pitting leg edema bilaterally. 1+DP/PT pulse bilaterally. Musculoskeletal: No joint deformities, No joint redness or warmth, no limitation of ROM in spin. Skin: No rashes.  Neuro: Alert, oriented X3, cranial nerves  II-XII grossly intact, moves all extremities normally.  Psych: Patient is not psychotic, no suicidal or hemocidal ideation.  Labs on Admission: I have personally reviewed following labs and imaging studies  CBC: Recent Labs  Lab 01/17/23 2202  WBC 17.5*  NEUTROABS 13.7*  HGB 11.9*  HCT 37.9  MCV 98.7  PLT 270   Basic Metabolic Panel: Recent Labs  Lab 01/17/23 2202  NA 139  K 4.0  CL 103  CO2 26  GLUCOSE 120*  BUN 15  CREATININE 1.13*  CALCIUM 9.6   GFR: Estimated Creatinine Clearance: 41.3 mL/min (A) (by C-G formula based on SCr of 1.13 mg/dL (H)). Liver Function Tests: Recent Labs  Lab 01/17/23 2202  AST 22  ALT 32  ALKPHOS 72  BILITOT 1.4*  PROT 7.4  ALBUMIN 4.3   No results for input(s): "LIPASE", "AMYLASE" in the last 168 hours. No results for input(s): "AMMONIA" in the last 168 hours. Coagulation Profile: Recent Labs  Lab 01/18/23 1037  INR 1.1   Cardiac Enzymes: No results for input(s): "CKTOTAL", "CKMB", "CKMBINDEX", "TROPONINI" in the last 168 hours. BNP (last 3 results) No results for input(s): "PROBNP" in the last 8760 hours. HbA1C: No results for input(s): "HGBA1C" in the last 72 hours. CBG: No results for input(s): "GLUCAP" in the last 168 hours. Lipid Profile: No results for input(s): "CHOL", "HDL", "LDLCALC", "TRIG", "CHOLHDL", "LDLDIRECT" in the last 72 hours. Thyroid Function Tests: No results for input(s): "TSH", "T4TOTAL", "FREET4", "T3FREE", "THYROIDAB" in the last 72 hours. Anemia Panel: No results for input(s): "VITAMINB12", "FOLATE", "FERRITIN", "TIBC", "IRON", "RETICCTPCT" in the last 72 hours. Urine analysis: No results found for: "COLORURINE", "APPEARANCEUR", "LABSPEC", "PHURINE", "GLUCOSEU", "HGBUR", "BILIRUBINUR", "KETONESUR", "PROTEINUR", "UROBILINOGEN", "NITRITE", "LEUKOCYTESUR" Sepsis Labs: @LABRCNTIP (procalcitonin:4,lacticidven:4) )No results found for this or any previous visit (from the past 240 hour(s)).    Radiological Exams on Admission: CT CHEST W CONTRAST  Result Date: 01/18/2023 CLINICAL DATA:  82 year old female with productive cough, started on antibiotics for sinus infection. Throat pain, painful swallowing. EXAM: CT CHEST WITH CONTRAST TECHNIQUE: Multidetector CT imaging of the chest was performed during intravenous contrast administration. RADIATION DOSE REDUCTION: This exam was performed according to the departmental dose-optimization program which includes automated exposure control, adjustment of the mA and/or kV according to patient size and/or use of iterative reconstruction technique. CONTRAST:  75mL OMNIPAQUE IOHEXOL 300 MG/ML SOLN in conjunction with contrast enhanced imaging of the neck reported separately. COMPARISON:  Neck CT today reported separately. Chest radiographs yesterday. Prior chest CT 03/10/2016.  FINDINGS: Cardiovascular: Calcified coronary artery and Calcified aortic atherosclerosis. Cardiac size is at the upper limits of normal to mildly enlarged. No pericardial effusion. Unremarkable other central mediastinal vascular structures. Mediastinum/Nodes: Generalized, circumferential esophageal wall thickening up to 8 mm (series 3, image 38) beginning at the thoracic inlet and continuing distally. Tapering of the esophageal wall at either a small gastric hiatal hernia or chronic phrenic ampulla. No significant mediastinal inflammatory stranding. No mediastinal gas, fluid, or lymphadenopathy. Lungs/Pleura: Mildly lower lung volumes compared to 2017. Major airways remain patent. Mediastinal lipomatosis affecting both middle lobes. Mild right middle lobe atelectasis. Linear platelike atelectasis and/or scarring in the lower lobes is not significantly changed compared to 2017. No consolidation or pleural effusion. A small 3 mm left lung nodule on series 4, image 59 is stable since 2017 and benign (no follow-up imaging recommended). Upper Abdomen: Chronic cholecystectomy and hepatic  steatosis. Visible liver, spleen, pancreas, adrenal glands, and left kidney appears stable since 2017. Decompressed stomach. Diverticulosis of the transverse colon. No upper abdominal free air or free fluid. Musculoskeletal: Chronically exaggerated upper thoracic kyphosis. No acute osseous abnormality identified. IMPRESSION: 1. Acute Esophagitis; abnormal circumferential wall thickening throughout the thoracic esophagus. Consider Candida Esophagitis in this age group, or might be related to suspected URI (Neck CT reported separately). 2. Negative for pneumonia, and no other acute finding in the Chest. 3.  Aortic Atherosclerosis (ICD10-I70.0).  Hepatic steatosis. Electronically Signed   By: Odessa Fleming M.D.   On: 01/18/2023 04:27   CT Soft Tissue Neck W Contrast  Result Date: 01/18/2023 CLINICAL DATA:  82 year old female with productive cough, started on antibiotics for sinus infection. Throat pain, painful swallowing. EXAM: CT NECK WITH CONTRAST TECHNIQUE: Multidetector CT imaging of the neck was performed using the standard protocol following the bolus administration of intravenous contrast. RADIATION DOSE REDUCTION: This exam was performed according to the departmental dose-optimization program which includes automated exposure control, adjustment of the mA and/or kV according to patient size and/or use of iterative reconstruction technique. CONTRAST:  75mL OMNIPAQUE IOHEXOL 300 MG/ML  SOLN COMPARISON:  Chest CT today reported separately. Chest radiographs yesterday. FINDINGS: Pharynx and larynx: Pharynx motion artifact including at the epiglottis. At the glottis level laryngeal contours are normal. Epiglottis probably remains normal on series 3, image 47. The soft palate appears edematous including the uvula (sagittal image 39). And there is mild generalized lingual tonsil, palatine tonsil and adenoid hypertrophy. No tonsillar hyperenhancement or separation identified. Parapharyngeal and retropharyngeal spaces  remain normal. Salivary glands: Negative when allowing for mild motion artifact. Thyroid: Negative. Lymph nodes: Nonenlarged bilateral level 2 lymph nodes may be mildly hyperenhancing (such as on the right series 8, image 38). But there are no abnormally enlarged, cystic or necrotic lymph nodes. Vascular: Major vascular structures in the neck and at the skull base are patent. Left carotid bifurcation calcified atherosclerosis. Limited intracranial: Negative; Calcified atherosclerosis at the skull base. Visualized orbits: Minimally included. Mastoids and visualized paranasal sinuses: Visualized paranasal sinuses and mastoids are clear. Tympanic cavities appear clear. Skeleton: Mandible motion artifact. Age-appropriate cervical spine degeneration. No acute osseous abnormality identified. Upper chest: Dedicated chest CT is reported separately today. IMPRESSION: 1. Mild motion artifact. 2. Soft palate and uvula appear edematous, along with generalized mild pharyngeal tonsillar hypertrophy. Favor URI with combined Pharyngitis/Tonsillitis. Angioedema felt less likely. No abscess or other complicating features. 3. Chest CT reported separately. Electronically Signed   By: Odessa Fleming M.D.   On: 01/18/2023 04:21   DG Chest 2 View  Result Date: 01/17/2023 CLINICAL DATA:  Productive cough, difficulty swallowing EXAM: CHEST - 2 VIEW COMPARISON:  None Available. FINDINGS: The heart size and mediastinal contours are within normal limits. Both lungs are clear. The visualized skeletal structures are unremarkable. IMPRESSION: No active cardiopulmonary disease. Electronically Signed   By: Sharlet Salina M.D.   On: 01/17/2023 22:54      Assessment/Plan Principal Problem:   Acute tonsillitis Active Problems:   Sepsis (HCC)   Esophagitis   Chronic atrial fibrillation with RVR (HCC)   GERD (gastroesophageal reflux disease)   Hyperlipidemia   Hypertension   Chronic diastolic CHF (congestive heart failure) (HCC)   Chronic  kidney disease, stage 3a (HCC)   Assessment and Plan:  Sepsis due to acute tonsillitis/pharyngitis: Patient meets criteria for sepsis with WBC 17.5, heart rate up to 123, temperature 100.4.  Lactic acid is normal.  Procalcitonin<0.10.  Consulted Dr. Jenne Campus of ENT.  -Admitted to PCU as inpatient -Unasyn IV -Decadron 10 mg every 8 hours -Clotrimazole troche per Dr. Jenne Campus -IVF: 2.5L of NS bolus in ED, followed by 75 cc/h   Esophagitis: Consulted Dr. Servando Snare of GI -Started Diflucan -Protonix 40 mg twice daily by IV  Chronic atrial fibrillation with RVR (HCC): Heart rate 110-120s -Continue Eliquis -Continue home metoprolol 12.5 mg daily -As needed metoprolol 5 mg every 2 hour for heart rate> 125  GERD (gastroesophageal reflux disease) -Home Protonix  Hyperlipidemia -Lipitor  Hypertension: -Continue metoprolol which is also for A-fib -Hold Lotensin and torsemide since patient is at risk of developing hypotension due to sepsis  Chronic diastolic CHF (congestive heart failure) (HCC): 2D echo on 12/11/2022 showed EF> 55% with grade 1 diastolic dysfunction.  Patient does not have leg edema JVD.  BNP is normal at 22.7.  CHF is compensated. -Hold torsemide  Chronic kidney disease, stage 3a (HCC): Renal function is close to baseline.  Baseline creatinine 0.98-1.21 recently.  Her creatinine is 1.13, BUN 15. -Follow-up with BMP         DVT ppx: on Eliquis  Code Status: Full code per pt  Family Communication:   Yes, patient's daughter at bed side.     Disposition Plan:  Anticipate discharge back to previous environment  Consults called:   Dr. Daleen Squibb of GI and Dr. Jenne Campus of ENT are consulted.  Admission status and Level of care: Progressive:  as inpt    Dispo: The patient is from: Home              Anticipated d/c is to: Home              Anticipated d/c date is: 2 days              Patient currently is not medically stable to d/c.      Severity of Illness:  The  appropriate patient status for this patient is INPATIENT. Inpatient status is judged to be reasonable and necessary in order to provide the required intensity of service to ensure the patient's safety. The patient's presenting symptoms, physical exam findings, and initial radiographic and laboratory data in the context of their chronic comorbidities is felt to place them at high risk for further clinical deterioration. Furthermore, it is not anticipated that the patient will be medically stable for discharge from the hospital within 2 midnights of admission.   * I certify that at the point of admission it is my clinical judgment that the patient will require inpatient hospital care spanning beyond 2 midnights from the point  of admission due to high intensity of service, high risk for further deterioration and high frequency of surveillance required.*       Date of Service 01/18/2023    Lorretta Harp [facility]   If 7PM-7AM, please contact night-coverage [email or web address] 01/18/2023, 5:27 PM

## 2023-01-18 NOTE — ED Notes (Signed)
Dr. Lars Pinks at bedside

## 2023-01-18 NOTE — Consult Note (Signed)
Jodi Minium, MD [facility]  [address] [place], [place] [address] Phone: [phone] Fax : [phone]  Consultation  Referring Provider:     Dr. Clyde Lundborg Primary Care Physician:  Gracelyn Nurse, MD Primary Gastroenterologist:  Dr. Norma Fredrickson          Reason for Consultation:     Abnormal CT scan of the chest  Date of Admission:  01/18/2023 Date of Consultation:  01/18/2023         HPI:   Jodi Stein is a 82 y.o. female who was admitted with trouble swallowing her pills and had a CT scan of the chest and neck that showed her to have acute tonsillitis.  The patient was also found to have thickening of the esophagus and the CT scan showed:  IMPRESSION: 1. Acute Esophagitis; abnormal circumferential wall thickening throughout the thoracic esophagus. Consider Candida Esophagitis in this age group, or might be related to suspected URI (Neck CT reported separately). 2. Negative for pneumonia, and no other acute finding in the Chest. 3.  Aortic Atherosclerosis (ICD10-I70.0).  Hepatic steatosis.  The patient had an EGD and colonoscopy back in February 2022.  The patient also was admitted to the hospital last March with rectal bleeding and was seen by Dr. Mia Creek. Last upper endoscopy showed a Schatzki's ring and hiatal hernia.  The patient now reports that her throat is very painful and it is making her hard to swallow.  She also reports having a lot of reflux.  Past Medical History:  Diagnosis Date   A-fib [facility])    Abdominal pain 2013   Arthritis    Bladder wall thickening    Constipation    Diverticulosis    Family history of adverse reaction to anesthesia    GERD (gastroesophageal reflux disease)    Hyperlipidemia    Hypertension    Neuroendocrine tumor    rectal   Obesity (BMI 30-39.9)    Steatosis of liver     Past Surgical History:  Procedure Laterality Date   ABDOMINAL HYSTERECTOMY     CHOLECYSTECTOMY     COLONOSCOPY N/A 09/20/2020   Procedure:  COLONOSCOPY;  Surgeon: Toledo, Boykin Nearing, MD;  Location: [facility] ENDOSCOPY;  Service: Gastroenterology;  Laterality: N/A;   COLONOSCOPY WITH PROPOFOL N/A 02/08/2016   Procedure: COLONOSCOPY WITH PROPOFOL;  Surgeon: Christena Deem, MD;  Location: [facility] ENDOSCOPY;  Service: Endoscopy;  Laterality: N/A;   ESOPHAGOGASTRODUODENOSCOPY (EGD) WITH PROPOFOL N/A 09/20/2020   Procedure: ESOPHAGOGASTRODUODENOSCOPY (EGD) WITH PROPOFOL;  Surgeon: Toledo, Boykin Nearing, MD;  Location: [facility] ENDOSCOPY;  Service: Gastroenterology;  Laterality: N/A;   EUS N/A 08/24/2016   Procedure: LOWER ENDOSCOPIC ULTRASOUND (EUS);  Surgeon: Rachael Fee, MD;  Location: [facility] ENDOSCOPY;  Service: Endoscopy;  Laterality: N/A;   EYE SURGERY     FLEXIBLE SIGMOIDOSCOPY N/A 08/08/2016   Procedure: FLEXIBLE SIGMOIDOSCOPY;  Surgeon: Christena Deem, MD;  Location: [facility] ENDOSCOPY;  Service: Endoscopy;  Laterality: N/A;   FLEXIBLE SIGMOIDOSCOPY N/A 04/04/2018   Procedure: FLEXIBLE SIGMOIDOSCOPY;  Surgeon: Christena Deem, MD;  Location: [facility] ENDOSCOPY;  Service: Endoscopy;  Laterality: N/A;   HERNIA REPAIR  2013   umbilical   HERNIA REPAIR  09/27/2012   ventral hernia   ROTATOR CUFF REPAIR Left     Prior to Admission medications   Medication Sig Start Date End Date Taking? Authorizing Provider  apixaban (ELIQUIS) 5 MG TABS tablet Take 1 tablet (5 mg total) by mouth 2 (two) times daily.  10/12/21   Lurene Shadow, MD  atorvastatin (LIPITOR) 40 MG tablet Take 40 mg by mouth daily. 12/17/19   [provider]  Cholecalciferol 25 MCG (1000 UT) tablet Take 1,000 Units by mouth daily. 11/07/19   [provider]  famotidine (PEPCID) 20 MG tablet Take 20 mg by mouth 2 (two) times daily.    [provider]  gabapentin (NEURONTIN) 100 MG capsule Take 100 mg by mouth 3 (three) times daily. 11/21/19   [provider]  ivabradine (CORLANOR) 5 MG TABS tablet Take 2 tablets (10mg ) by mouth TWO hours prior to your  cardiac CT Scan. 12/22/22   Debbe Odea, MD  losartan (COZAAR) 25 MG tablet Take 25 mg by mouth daily.    [provider]  metoprolol tartrate (LOPRESSOR) 25 MG tablet Take 12.5 mg by mouth daily.    [provider]  pantoprazole (PROTONIX) 40 MG tablet Take 40 mg by mouth daily.    [provider]  polyethylene glycol (MIRALAX / GLYCOLAX) 17 g packet Take 17 g by mouth 2 (two) times daily. Mix in 4-8oz fluid 10/10/21   Lurene Shadow, MD  psyllium (HYDROCIL/METAMUCIL) 95 % PACK Take 1 packet by mouth daily. 10/11/21   Lurene Shadow, MD  sucralfate (CARAFATE) 1 g tablet Take 1 g by mouth in the morning, at noon, and at bedtime.    [provider]  torsemide (DEMADEX) 10 MG tablet Take 10 mg by mouth 2 (two) times daily.    [provider]    Family History  Problem Relation Age of Onset   Breast cancer Sister      Social History   Tobacco Use   Smoking status: Former    Packs/day: 0.25    Years: 2.00    Additional pack years: 0.00    Total pack years: 0.50    Types: Cigarettes    Quit date: 07/24/1961    Years since quitting: 61.5   Smokeless tobacco: Never  Vaping Use   Vaping Use: Never used  Substance Use Topics   Alcohol use: No   Drug use: No    Allergies as of 01/17/2023 - Review Complete 01/17/2023  Allergen Reaction Noted   Codeine Nausea And Vomiting 02/07/2016   Tramadol Nausea And Vomiting 02/07/2016    Review of Systems:    All systems reviewed and negative except where noted in HPI.   Physical Exam:  Vital signs in last 24 hours: Temp:  [97.5 F (36.4 C)-100.4 F (38 C)] 98.4 F (36.9 C) (06/27 0746) Pulse Rate:  [114-124] 118 (06/27 0746) Resp:  [14-21] 20 (06/27 0746) BP: (122-161)/(66-91) 122/66 (06/27 0746) SpO2:  [93 %-98 %] 98 % (06/27 0746) Weight:  [95.3 kg] 95.3 kg (06/27 0737)   General:   Pleasant, cooperative in NAD Head:  Normocephalic and atraumatic. Eyes:   No icterus.   Conjunctiva  pink. PERRLA. Ears:  Normal auditory acuity. Neck:  Supple; no masses or thyroidomegaly Lungs: Respirations even and unlabored. Lungs clear to auscultation bilaterally.   No wheezes, crackles, or rhonchi.  Heart:  Regular rate and rhythm;  Without murmur, clicks, rubs or gallops Abdomen:  Soft, nondistended, nontender. Normal bowel sounds. No appreciable masses or hepatomegaly.  No rebound or guarding.  Rectal:  Not performed. Msk:  Symmetrical without gross deformities.   Extremities:  Without edema, cyanosis or clubbing. Neurologic:  Alert and oriented x3;  grossly normal neurologically. Skin:  Intact without significant lesions or rashes. Cervical Nodes:  No significant  cervical adenopathy. Psych:  Alert and cooperative. Normal affect.  LAB RESULTS: Recent Labs    01/17/23 2202  WBC 17.5*  HGB 11.9*  HCT 37.9  PLT 270   BMET Recent Labs    01/17/23 2202  NA 139  K 4.0  CL 103  CO2 26  GLUCOSE 120*  BUN 15  CREATININE 1.13*  CALCIUM 9.6   LFT Recent Labs    01/17/23 2202  PROT 7.4  ALBUMIN 4.3  AST 22  ALT 32  ALKPHOS 72  BILITOT 1.4*   PT/INR No results for input(s): "LABPROT", "INR" in the last 72 hours.  STUDIES: CT CHEST W CONTRAST  Result Date: 01/18/2023 CLINICAL DATA:  82 year old female with productive cough, started on antibiotics for sinus infection. Throat pain, painful swallowing. EXAM: CT CHEST WITH CONTRAST TECHNIQUE: Multidetector CT imaging of the chest was performed during intravenous contrast administration. RADIATION DOSE REDUCTION: This exam was performed according to the departmental dose-optimization program which includes automated exposure control, adjustment of the mA and/or kV according to patient size and/or use of iterative reconstruction technique. CONTRAST:  75mL OMNIPAQUE IOHEXOL 300 MG/ML SOLN in conjunction with contrast enhanced imaging of the neck reported separately. COMPARISON:  Neck CT today reported separately. Chest  radiographs yesterday. Prior chest CT 03/10/2016. FINDINGS: Cardiovascular: Calcified coronary artery and Calcified aortic atherosclerosis. Cardiac size is at the upper limits of normal to mildly enlarged. No pericardial effusion. Unremarkable other central mediastinal vascular structures. Mediastinum/Nodes: Generalized, circumferential esophageal wall thickening up to 8 mm (series 3, image 38) beginning at the thoracic inlet and continuing distally. Tapering of the esophageal wall at either a small gastric hiatal hernia or chronic phrenic ampulla. No significant mediastinal inflammatory stranding. No mediastinal gas, fluid, or lymphadenopathy. Lungs/Pleura: Mildly lower lung volumes compared to 2017. Major airways remain patent. Mediastinal lipomatosis affecting both middle lobes. Mild right middle lobe atelectasis. Linear platelike atelectasis and/or scarring in the lower lobes is not significantly changed compared to 2017. No consolidation or pleural effusion. A small 3 mm left lung nodule on series 4, image 59 is stable since 2017 and benign (no follow-up imaging recommended). Upper Abdomen: Chronic cholecystectomy and hepatic steatosis. Visible liver, spleen, pancreas, adrenal glands, and left kidney appears stable since 2017. Decompressed stomach. Diverticulosis of the transverse colon. No upper abdominal free air or free fluid. Musculoskeletal: Chronically exaggerated upper thoracic kyphosis. No acute osseous abnormality identified. IMPRESSION: 1. Acute Esophagitis; abnormal circumferential wall thickening throughout the thoracic esophagus. Consider Candida Esophagitis in this age group, or might be related to suspected URI (Neck CT reported separately). 2. Negative for pneumonia, and no other acute finding in the Chest. 3.  Aortic Atherosclerosis (ICD10-I70.0).  Hepatic steatosis. Electronically Signed   By: Odessa Fleming M.D.   On: 01/18/2023 04:27   CT Soft Tissue Neck W Contrast  Result Date:  01/18/2023 CLINICAL DATA:  82 year old female with productive cough, started on antibiotics for sinus infection. Throat pain, painful swallowing. EXAM: CT NECK WITH CONTRAST TECHNIQUE: Multidetector CT imaging of the neck was performed using the standard protocol following the bolus administration of intravenous contrast. RADIATION DOSE REDUCTION: This exam was performed according to the departmental dose-optimization program which includes automated exposure control, adjustment of the mA and/or kV according to patient size and/or use of iterative reconstruction technique. CONTRAST:  75mL OMNIPAQUE IOHEXOL 300 MG/ML  SOLN COMPARISON:  Chest CT today reported separately. Chest radiographs yesterday. FINDINGS: Pharynx and larynx: Pharynx motion artifact including at the epiglottis. At the glottis level  laryngeal contours are normal. Epiglottis probably remains normal on series 3, image 47. The soft palate appears edematous including the uvula (sagittal image 39). And there is mild generalized lingual tonsil, palatine tonsil and adenoid hypertrophy. No tonsillar hyperenhancement or separation identified. Parapharyngeal and retropharyngeal spaces remain normal. Salivary glands: Negative when allowing for mild motion artifact. Thyroid: Negative. Lymph nodes: Nonenlarged bilateral level 2 lymph nodes may be mildly hyperenhancing (such as on the right series 8, image 38). But there are no abnormally enlarged, cystic or necrotic lymph nodes. Vascular: Major vascular structures in the neck and at the skull base are patent. Left carotid bifurcation calcified atherosclerosis. Limited intracranial: Negative; Calcified atherosclerosis at the skull base. Visualized orbits: Minimally included. Mastoids and visualized paranasal sinuses: Visualized paranasal sinuses and mastoids are clear. Tympanic cavities appear clear. Skeleton: Mandible motion artifact. Age-appropriate cervical spine degeneration. No acute osseous abnormality  identified. Upper chest: Dedicated chest CT is reported separately today. IMPRESSION: 1. Mild motion artifact. 2. Soft palate and uvula appear edematous, along with generalized mild pharyngeal tonsillar hypertrophy. Favor URI with combined Pharyngitis/Tonsillitis. Angioedema felt less likely. No abscess or other complicating features. 3. Chest CT reported separately. Electronically Signed   By: Odessa Fleming M.D.   On: 01/18/2023 04:21   DG Chest 2 View  Result Date: 01/17/2023 CLINICAL DATA:  Productive cough, difficulty swallowing EXAM: CHEST - 2 VIEW COMPARISON:  None Available. FINDINGS: The heart size and mediastinal contours are within normal limits. Both lungs are clear. The visualized skeletal structures are unremarkable. IMPRESSION: No active cardiopulmonary disease. Electronically Signed   By: Sharlet Salina M.D.   On: 01/17/2023 22:54      Impression / Plan:   Assessment: Principal Problem:   Acute tonsillitis Active Problems:   GERD (gastroesophageal reflux disease)   Hyperlipidemia   Hypertension   Chronic kidney disease, stage 3a (HCC)   Sepsis (HCC)   Esophagitis   HLD (hyperlipidemia)   Chronic diastolic CHF (congestive heart failure) (HCC)   Chronic atrial fibrillation with RVR (HCC)   TEYA OTTERSON is a 82 y.o. y/o female with with a admission to the ER for acute tonsillitis.  The patient was considered for an upper endoscopy but due to the airway risk and sedation risks this was deferred.  The patient has been having reflux and may have reflux esophagitis versus candidal esophagitis that was reported as a possibility on the CT scan.  Plan:  I would recommend treating the patient with a PPI whether IV or p.o. if the patient could handle it.  I would defer any further endoscopic procedures until the acute tonsillitis and airway issues are resolved.  If the patient improves on the PPI then she can follow-up with her gastroenterologist as an outpatient.  I am not anticipating  any endoscopic procedures on this patient anytime soon with her current problems therefore outpatient follow-up may be more prudent.  The patient has been explained the plan and agrees with it.  Thank you for involving me in the care of this patient.      LOS: 0 days   Jodi Minium, MD, [facility] 01/18/2023, 11:30 AM,  Pager [phone] 7am-5pm  Check AMION for 5pm -7am coverage and on weekends   Note: This dictation was prepared with Dragon dictation along with smaller phrase technology. Any transcriptional errors that result from this process are unintentional.

## 2023-01-18 NOTE — ED Notes (Signed)
Dr. Jenne Campus, EENT at bedside

## 2023-01-19 ENCOUNTER — Telehealth (HOSPITAL_COMMUNITY): Payer: Self-pay | Admitting: *Deleted

## 2023-01-19 DIAGNOSIS — K209 Esophagitis, unspecified without bleeding: Secondary | ICD-10-CM | POA: Diagnosis not present

## 2023-01-19 DIAGNOSIS — R739 Hyperglycemia, unspecified: Secondary | ICD-10-CM

## 2023-01-19 DIAGNOSIS — I480 Paroxysmal atrial fibrillation: Secondary | ICD-10-CM | POA: Diagnosis not present

## 2023-01-19 DIAGNOSIS — A419 Sepsis, unspecified organism: Principal | ICD-10-CM

## 2023-01-19 DIAGNOSIS — J039 Acute tonsillitis, unspecified: Secondary | ICD-10-CM | POA: Diagnosis not present

## 2023-01-19 LAB — BASIC METABOLIC PANEL
Anion gap: 7 (ref 5–15)
BUN: 13 mg/dL (ref 8–23)
CO2: 23 mmol/L (ref 22–32)
Calcium: 8.9 mg/dL (ref 8.9–10.3)
Chloride: 112 mmol/L — ABNORMAL HIGH (ref 98–111)
Creatinine, Ser: 0.93 mg/dL (ref 0.44–1.00)
GFR, Estimated: >60 mL/min (ref >60)
Glucose, Bld: 162 mg/dL — ABNORMAL HIGH (ref 70–99)
Potassium: 4 mmol/L (ref 3.5–5.1)
Sodium: 142 mmol/L (ref 135–145)

## 2023-01-19 LAB — CBC
HCT: 31.2 % — ABNORMAL LOW (ref 36.0–46.0)
Hemoglobin: 10 g/dL — ABNORMAL LOW (ref 12.0–15.0)
MCH: 31 pg (ref 26.0–34.0)
MCHC: 32.1 g/dL (ref 30.0–36.0)
MCV: 96.6 fL (ref 80.0–100.0)
Platelets: 244 10*3/uL (ref 150–400)
RBC: 3.23 MIL/uL — ABNORMAL LOW (ref 3.87–5.11)
RDW: 13.8 % (ref 11.5–15.5)
WBC: 16.3 10*3/uL — ABNORMAL HIGH (ref 4.0–10.5)
nRBC: 0 % (ref 0.0–0.2)

## 2023-01-19 MED ORDER — LOSARTAN POTASSIUM 25 MG PO TABS
25.0000 mg | ORAL_TABLET | Freq: Every day | ORAL | Status: DC
  Administered 2023-01-19 – 2023-01-20 (×2): 25 mg via ORAL
  Filled 2023-01-19 (×2): qty 1

## 2023-01-19 MED ORDER — BISACODYL 10 MG RE SUPP
10.0000 mg | Freq: Once | RECTAL | Status: AC
  Administered 2023-01-19: 10 mg via RECTAL
  Filled 2023-01-19: qty 1

## 2023-01-19 MED ORDER — TORSEMIDE 20 MG PO TABS
10.0000 mg | ORAL_TABLET | Freq: Two times a day (BID) | ORAL | Status: DC
  Administered 2023-01-19 – 2023-01-20 (×2): 10 mg via ORAL
  Filled 2023-01-19 (×2): qty 1

## 2023-01-19 NOTE — Progress Notes (Signed)
  Progress Note   Patient: Jodi Stein UJW:[id number] DOB: January 03, 1941 DOA: 01/18/2023     1 DOS: the patient was seen and examined on 01/19/2023   Brief hospital course: LARICA YOGI is a 82 y.o. female with medical history significant of HTN, HLD, dCHF, GERD, CKD-3a, A fib on Eliquis, steatosis of liver, rectal neuroendocrine tumor (carcinoid tumor), sore throat, difficulty swallowing, fever. Pt states that she has sore throat for more than 3 days.  She has cough with thick mucus production.  She has nasal congestion, difficulty swallowing due to pain.  She has nausea. Pt was seen [facility] and was prescribed Z-Pak, but has been having trouble swallowing it because its hurting her throat.  CT chest showed acute esophagitis with abnormal circumferential wall thickening throughout the thoracic esophagus, CT neck soft tissue showed soft palate and overlap.  Mellitus along with generalized mild pharyngeal tonsillar hypertrophy.  URI pharyngitis, tonsillitis possibility.  Patient is admitted to the hospital service for further management evaluation of sepsis due to acute tonsillitis pharyngitis.  GI, ENT consulted.  Assessment and Plan: Sepsis due to acute tonsillitis/pharyngitis:  Possible esophagitis Fever improved, white count 21, tachycardia better. Patient feels better with swallowing patient states appetite. Continue Unasyn, Decadron as advised by ENT. Continue Diflucan, PPI as advised by GI Will continue to advance diet as tolerated   Chronic atrial fibrillation with RVR (HCC): Heart rate better controlled. Continue metoprolol, Eliquis.   GERD (gastroesophageal reflux disease) Continue Protonix   Hyperglycemia Will check blood sugars. A1C ordered.   Hypertension: Continue metoprolol. BP improved.  Will restart home medications.   Chronic diastolic CHF (congestive heart failure) (HCC):  Will start home torsemide   Chronic kidney disease, stage 3a (HCC): Renal function is close to  baseline.  Baseline creatinine 0.98-1.21 recently.  Her creatinine is 1.13, BUN 15. Follow-up with BMP.       Subjective: Patient seen and examined today morning.  She is feeling better today, able to swallow clears.  Wishes to eat soft diet.  Remains afebrile.  Denies any complaints.  Physical Exam: Vitals:   01/19/23 0300 01/19/23 0418 01/19/23 0803 01/19/23 1128  BP:  138/85 (!) 150/82 130/63  Pulse:  86 83 96  Resp:  18 18 18   Temp:  98.3 F (36.8 C) 97.9 F (36.6 C) 98.2 F (36.8 C)  TempSrc:  Oral    SpO2:  96% 100% 100%  Weight: 89.9 kg     Height:       General - Elderly African American female, no apparent distress HEENT - PERRLA, EOMI, atraumatic head, non tender sinuses. Lung - Clear, rales, rhonchi, wheezes. Heart - S1, S2 heard, no murmurs, rubs, trace pedal edema Neuro - Alert, awake and oriented x 3, non focal exam. Skin - Warm and dry. Data Reviewed:  BMP, CBC, blood sugars  Family Communication: Patient understands and agree with above care plan.  Disposition: Status is: Inpatient Remains inpatient appropriate because: IV antibiotic therapy, advance diet  Planned Discharge Destination: Home    Time spent: 44 minutes  Author: Marcelino Duster, MD 01/19/2023 3:19 PM  For on call review [email or web address].

## 2023-01-19 NOTE — Telephone Encounter (Signed)
Calling patient to review her cardiac CT instructions.  Patient states she is currently admitted with an infection.  Her CCTA appointment is canceled.  She states that she wants to ask Dr. Juliann Pares if this test is still necessary before re-scheduling.  Larey Brick RN Navigator Cardiac Imaging [facility] [phone] Office [phone] Cell

## 2023-01-19 NOTE — Progress Notes (Signed)
Pharmacy Antibiotic Note  Jodi Stein is a 82 y.o. female admitted on 01/18/2023 with tonsillitis/ ?Esophageal candidasis. Pharmacy has been consulted for Unasyn and Fluconazole dosing.  Plan: -Continue Unasyn 3 gm IV q6h     -Continue Fluconazole 200 mg IV q24h    Crcl 48.6 ml/min  F/u renal fxn and cultures   Height: 5\' 2"  (157.5 cm) Weight: 89.9 kg (198 lb 3.1 oz) IBW/kg (Calculated) : 50.1  Temp (24hrs), Avg:98 F (36.7 C), Min:97.6 F (36.4 C), Max:98.3 F (36.8 C)  Recent Labs  Lab 01/17/23 2202 01/19/23 0615  WBC 17.5* 16.3*  CREATININE 1.13* 0.93  LATICACIDVEN 1.4  --      Estimated Creatinine Clearance: 48.6 mL/min (by C-G formula based on SCr of 0.93 mg/dL).    Allergies  Allergen Reactions   Codeine Nausea And Vomiting   Tramadol Nausea And Vomiting    Antimicrobials this admission: Unasyn 6/27>> Azithromycin 6/27>> Fluconazole 6/27>>  Dose adjustments this admission:    Microbiology results: 6/27 BCx: NGTD  CT of neck soft tissue showed possible tonsillitis/pharyngitis, no abscess.  CT of chest showed esophagitis   Thank you for allowing pharmacy to be a part of this patient's care.  Takaya Hyslop A Marquest Gunkel 01/19/2023 11:42 AM

## 2023-01-19 NOTE — Progress Notes (Signed)
Jodi Minium, MD [facility]   [address], [address] [place], [place] [address] Phone: [phone] Fax : [phone]   Subjective: This patient was admitted with tonsillitis and inability to swallow.  The patient was thought to possibly have esophageal candidiasis versus reflux esophagitis due to a CT scan showing thickening of the esophagus.  The patient is doing much better today with eating a soft diet.  She has also been started on treatment for both esophageal Candida by ENT and was started on a PPI on admission for her reflux.   Objective: Vital signs in last 24 hours: Vitals:   01/19/23 0300 01/19/23 0418 01/19/23 0803 01/19/23 1128  BP:  138/85 (!) 150/82 130/63  Pulse:  86 83 96  Resp:  18 18 18   Temp:  98.3 F (36.8 C) 97.9 F (36.6 C) 98.2 F (36.8 C)  TempSrc:  Oral    SpO2:  96% 100% 100%  Weight: 89.9 kg     Height:       Weight change:  No intake or output data in the 24 hours ending 01/19/23 1302   Exam: Heart:: Regular rate and rhythm, S1S2 present, or without murmur or extra heart sounds Lungs: normal and clear to auscultation and percussion Abdomen: soft, nontender, normal bowel sounds   Lab Results: @LABTEST2 @ Micro Results: Recent Results (from the past 240 hour(s))  Culture, blood (routine x 2)     Status: None (Preliminary result)   Collection Time: 01/17/23 10:02 PM   Specimen: BLOOD RIGHT ARM  Result Value Ref Range Status   Specimen Description BLOOD RIGHT ARM  Final   Special Requests   Final    BOTTLES DRAWN AEROBIC AND ANAEROBIC Blood Culture adequate volume   Culture   Final    NO GROWTH < 24 HOURS Performed at [facility], [address] [place], [place] [address]    Report Status PENDING  Incomplete  Culture, blood (routine x 2)     Status: None (Preliminary result)   Collection Time: 01/18/23  6:37 AM   Specimen: BLOOD RIGHT HAND  Result Value Ref Range Status   Specimen Description BLOOD RIGHT HAND  Final    Special Requests   Final    BOTTLES DRAWN AEROBIC AND ANAEROBIC Blood Culture results may not be optimal due to an excessive volume of blood received in culture bottles   Culture   Final    NO GROWTH < 24 HOURS Performed at [facility], [address], [place], [place] [address]    Report Status PENDING  Incomplete   Studies/Results: CT CHEST W CONTRAST  Result Date: 01/18/2023 CLINICAL DATA:  82 year old female with productive cough, started on antibiotics for sinus infection. Throat pain, painful swallowing. EXAM: CT CHEST WITH CONTRAST TECHNIQUE: Multidetector CT imaging of the chest was performed during intravenous contrast administration. RADIATION DOSE REDUCTION: This exam was performed according to the departmental dose-optimization program which includes automated exposure control, adjustment of the mA and/or kV according to patient size and/or use of iterative reconstruction technique. CONTRAST:  75mL OMNIPAQUE IOHEXOL 300 MG/ML SOLN in conjunction with contrast enhanced imaging of the neck reported separately. COMPARISON:  Neck CT today reported separately. Chest radiographs yesterday. Prior chest CT 03/10/2016. FINDINGS: Cardiovascular: Calcified coronary artery and Calcified aortic atherosclerosis. Cardiac size is at the upper limits of normal to mildly enlarged. No pericardial effusion. Unremarkable other central mediastinal vascular structures. Mediastinum/Nodes: Generalized, circumferential esophageal wall thickening up to 8 mm (series 3, image 38)  beginning at the thoracic inlet and continuing distally. Tapering of the esophageal wall at either a small gastric hiatal hernia or chronic phrenic ampulla. No significant mediastinal inflammatory stranding. No mediastinal gas, fluid, or lymphadenopathy. Lungs/Pleura: Mildly lower lung volumes compared to 2017. Major airways remain patent. Mediastinal lipomatosis affecting both middle lobes. Mild right middle lobe atelectasis. Linear  platelike atelectasis and/or scarring in the lower lobes is not significantly changed compared to 2017. No consolidation or pleural effusion. A small 3 mm left lung nodule on series 4, image 59 is stable since 2017 and benign (no follow-up imaging recommended). Upper Abdomen: Chronic cholecystectomy and hepatic steatosis. Visible liver, spleen, pancreas, adrenal glands, and left kidney appears stable since 2017. Decompressed stomach. Diverticulosis of the transverse colon. No upper abdominal free air or free fluid. Musculoskeletal: Chronically exaggerated upper thoracic kyphosis. No acute osseous abnormality identified. IMPRESSION: 1. Acute Esophagitis; abnormal circumferential wall thickening throughout the thoracic esophagus. Consider Candida Esophagitis in this age group, or might be related to suspected URI (Neck CT reported separately). 2. Negative for pneumonia, and no other acute finding in the Chest. 3.  Aortic Atherosclerosis (ICD10-I70.0).  Hepatic steatosis. Electronically Signed   By: Odessa Fleming M.D.   On: 01/18/2023 04:27   CT Soft Tissue Neck W Contrast  Result Date: 01/18/2023 CLINICAL DATA:  82 year old female with productive cough, started on antibiotics for sinus infection. Throat pain, painful swallowing. EXAM: CT NECK WITH CONTRAST TECHNIQUE: Multidetector CT imaging of the neck was performed using the standard protocol following the bolus administration of intravenous contrast. RADIATION DOSE REDUCTION: This exam was performed according to the departmental dose-optimization program which includes automated exposure control, adjustment of the mA and/or kV according to patient size and/or use of iterative reconstruction technique. CONTRAST:  75mL OMNIPAQUE IOHEXOL 300 MG/ML  SOLN COMPARISON:  Chest CT today reported separately. Chest radiographs yesterday. FINDINGS: Pharynx and larynx: Pharynx motion artifact including at the epiglottis. At the glottis level laryngeal contours are normal.  Epiglottis probably remains normal on series 3, image 47. The soft palate appears edematous including the uvula (sagittal image 39). And there is mild generalized lingual tonsil, palatine tonsil and adenoid hypertrophy. No tonsillar hyperenhancement or separation identified. Parapharyngeal and retropharyngeal spaces remain normal. Salivary glands: Negative when allowing for mild motion artifact. Thyroid: Negative. Lymph nodes: Nonenlarged bilateral level 2 lymph nodes may be mildly hyperenhancing (such as on the right series 8, image 38). But there are no abnormally enlarged, cystic or necrotic lymph nodes. Vascular: Major vascular structures in the neck and at the skull base are patent. Left carotid bifurcation calcified atherosclerosis. Limited intracranial: Negative; Calcified atherosclerosis at the skull base. Visualized orbits: Minimally included. Mastoids and visualized paranasal sinuses: Visualized paranasal sinuses and mastoids are clear. Tympanic cavities appear clear. Skeleton: Mandible motion artifact. Age-appropriate cervical spine degeneration. No acute osseous abnormality identified. Upper chest: Dedicated chest CT is reported separately today. IMPRESSION: 1. Mild motion artifact. 2. Soft palate and uvula appear edematous, along with generalized mild pharyngeal tonsillar hypertrophy. Favor URI with combined Pharyngitis/Tonsillitis. Angioedema felt less likely. No abscess or other complicating features. 3. Chest CT reported separately. Electronically Signed   By: Odessa Fleming M.D.   On: 01/18/2023 04:21   DG Chest 2 View  Result Date: 01/17/2023 CLINICAL DATA:  Productive cough, difficulty swallowing EXAM: CHEST - 2 VIEW COMPARISON:  None Available. FINDINGS: The heart size and mediastinal contours are within normal limits. Both lungs are clear. The visualized skeletal structures are unremarkable. IMPRESSION: No active  cardiopulmonary disease. Electronically Signed   By: Sharlet Salina M.D.   On:  01/17/2023 22:54   Medications: I have reviewed the patient's current medications. Scheduled Meds:  apixaban  5 mg Oral BID   atorvastatin  40 mg Oral Daily   cholecalciferol  1,000 Units Oral Daily   clotrimazole  10 mg Oral 5 X Daily   dexamethasone (DECADRON) injection  10 mg Intravenous Q8H   fluticasone  1 spray Each Nare Daily   gabapentin  100 mg Oral TID   metoprolol tartrate  12.5 mg Oral Daily   pantoprazole (PROTONIX) IV  40 mg Intravenous Q12H   psyllium  1 packet Oral Daily   senna-docusate  1 tablet Oral BID   Continuous Infusions:  sodium chloride 75 mL/hr at 01/19/23 0021   ampicillin-sulbactam (UNASYN) IV 3 g (01/19/23 0912)   fluconazole (DIFLUCAN) IV 200 mg (01/19/23 1042)   PRN Meds:.acetaminophen (TYLENOL) oral liquid 160 mg/5 mL, acetaminophen, fentaNYL (SUBLIMAZE) injection, hydrALAZINE, metoprolol tartrate, ondansetron (ZOFRAN) IV, oxyCODONE-acetaminophen, phenol, polyethylene glycol   Assessment: Principal Problem:   Acute tonsillitis Active Problems:   GERD (gastroesophageal reflux disease)   Hyperlipidemia   Hypertension   Chronic kidney disease, stage 3a (HCC)   Sepsis (HCC)   Esophagitis   Chronic diastolic CHF (congestive heart failure) (HCC)   Chronic atrial fibrillation with RVR (HCC)    Plan: This patient is being treated for esophagitis and tonsillitis without any need for any urgent endoscopic procedures at this time.  The patient has had an EGD and colonoscopy in the past by Dr. Norma Fredrickson and has been recommended if her symptoms persist to follow-up with him as an outpatient.  There is nothing further to do for this patient.  I will sign off.  Please call if any further GI concerns or questions.  We would like to thank you for the opportunity to participate in the care of NASHARA PIERSOL.     LOS: 1 day   Sherlyn Hay 01/19/2023, 1:02 PM Pager [phone] 7am-5pm  Check AMION for 5pm -7am coverage and on weekends

## 2023-01-19 NOTE — Plan of Care (Signed)

## 2023-01-20 DIAGNOSIS — I482 Chronic atrial fibrillation, unspecified: Secondary | ICD-10-CM | POA: Diagnosis not present

## 2023-01-20 DIAGNOSIS — K209 Esophagitis, unspecified without bleeding: Secondary | ICD-10-CM | POA: Diagnosis not present

## 2023-01-20 DIAGNOSIS — A419 Sepsis, unspecified organism: Secondary | ICD-10-CM | POA: Diagnosis not present

## 2023-01-20 DIAGNOSIS — E782 Mixed hyperlipidemia: Secondary | ICD-10-CM

## 2023-01-20 DIAGNOSIS — J039 Acute tonsillitis, unspecified: Secondary | ICD-10-CM | POA: Diagnosis not present

## 2023-01-20 LAB — CBC
HCT: 31.6 % — ABNORMAL LOW (ref 36.0–46.0)
Hemoglobin: 9.8 g/dL — ABNORMAL LOW (ref 12.0–15.0)
MCH: 30.4 pg (ref 26.0–34.0)
MCHC: 31 g/dL (ref 30.0–36.0)
MCV: 98.1 fL (ref 80.0–100.0)
Platelets: 240 10*3/uL (ref 150–400)
RBC: 3.22 MIL/uL — ABNORMAL LOW (ref 3.87–5.11)
RDW: 14.1 % (ref 11.5–15.5)
WBC: 15.2 10*3/uL — ABNORMAL HIGH (ref 4.0–10.5)
nRBC: 0 % (ref 0.0–0.2)

## 2023-01-20 LAB — HEMOGLOBIN A1C
Hgb A1c MFr Bld: 6.7 % — ABNORMAL HIGH (ref 4.8–5.6)
Mean Plasma Glucose: 146 mg/dL

## 2023-01-20 MED ORDER — DEXAMETHASONE 2 MG PO TABS
ORAL_TABLET | ORAL | 0 refills | Status: DC
  Filled 2023-01-20: qty 26, 8d supply, fill #0

## 2023-01-20 MED ORDER — CLOTRIMAZOLE 10 MG MT TROC: 10.0000 mg | Freq: Three times a day (TID) | OROMUCOSAL | 0 refills | Status: AC

## 2023-01-20 MED ORDER — FLUCONAZOLE 100 MG PO TABS: 100.0000 mg | ORAL_TABLET | Freq: Every day | ORAL | 0 refills | Status: AC

## 2023-01-20 MED ORDER — AMOXICILLIN-POT CLAVULANATE 600-42.9 MG/5ML PO SUSR: 600.0000 mg | Freq: Two times a day (BID) | ORAL | 0 refills | Status: AC

## 2023-01-20 MED ORDER — PHENOL 1.4 % MT LIQD: 1.0000 | OROMUCOSAL | 0 refills | Status: AC | PRN

## 2023-01-20 MED ORDER — DEXAMETHASONE 2 MG PO TABS: ORAL_TABLET | ORAL | 0 refills | Status: AC

## 2023-01-20 MED ORDER — CLOTRIMAZOLE 10 MG MT TROC
10.0000 mg | Freq: Three times a day (TID) | OROMUCOSAL | 0 refills | Status: DC
  Filled 2023-01-20: qty 30, 10d supply, fill #0

## 2023-01-20 MED ORDER — PHENOL 1.4 % MT LIQD
1.0000 | OROMUCOSAL | 0 refills | Status: DC | PRN
  Filled 2023-01-20: qty 118, fill #0

## 2023-01-20 MED ORDER — AMOXICILLIN-POT CLAVULANATE 600-42.9 MG/5ML PO SUSR
600.0000 mg | Freq: Two times a day (BID) | ORAL | 0 refills | Status: DC
  Filled 2023-01-20: qty 75, 8d supply, fill #0

## 2023-01-20 MED ORDER — FLUCONAZOLE 100 MG PO TABS
100.0000 mg | ORAL_TABLET | Freq: Every day | ORAL | 0 refills | Status: DC
  Filled 2023-01-20: qty 14, 14d supply, fill #0

## 2023-01-20 NOTE — Discharge Summary (Signed)
Physician Discharge Summary   Patient: Jodi Stein MRN: [id number] DOB: 02/17/1941  Admit date:     01/18/2023  Discharge date: 01/20/23  Discharge Physician: Marcelino Duster   PCP: Gracelyn Nurse, MD   Recommendations at discharge:   PCP follow-up in 1 week. ENT follow-up as scheduled  Discharge Diagnoses: Principal Problem:   Acute tonsillitis Active Problems:   Sepsis (HCC)   Esophagitis   Chronic atrial fibrillation with RVR (HCC)   GERD (gastroesophageal reflux disease)   Hyperlipidemia   Hypertension   Chronic diastolic CHF (congestive heart failure) (HCC)   Chronic kidney disease, stage 3a (HCC)  Resolved Problems:   * No resolved hospital problems. [facility] Hospital Course: Jodi Stein is a 82 y.o. female with medical history significant of HTN, HLD, dCHF, GERD, CKD-3a, A fib on Eliquis, steatosis of liver, rectal neuroendocrine tumor (carcinoid tumor), sore throat, difficulty swallowing, fever. Pt states that she has sore throat for more than 3 days.  She has cough with thick mucus production.  She has nasal congestion, difficulty swallowing due to pain.  She has nausea. Pt was seen [facility] and was prescribed Z-Pak, but has been having trouble swallowing it because its hurting her throat.  CT chest showed acute esophagitis with abnormal circumferential wall thickening throughout the thoracic esophagus, CT neck soft tissue showed soft palate and overlap.  Mellitus along with generalized mild pharyngeal tonsillar hypertrophy.  URI pharyngitis, tonsillitis possibility.   Patient is admitted to the hospital service for further management evaluation of sepsis due to acute tonsillitis pharyngitis.  GI, ENT consulted.  Patient is started on broad-spectrum antibiotics, GI evaluated advised no urgent endoscopic procedures needed.  ENT performed flexible fiberoptic planning laryngoscopy advised to treat like thrush, started on Diflucan therapy.  Patient is continued on Unasyn,  Decadron and fluconazole, Mycelex troches.  Patient's pain, swelling significantly improved and she is able to tolerate diet.  Sepsis resolved. Patient would like to be discharged as her daughter is arriving today from out of state.  I discussed with her regarding oral antibiotics, steroid taper, antifungal therapy and advised to follow-up with primary care physician, ENT as scheduled.  Patient's A1c 6.7, advised carb consistent diet, keep a log of blood sugars for PCP to start oral hypoglycemics as needed.  New prescription for Augmentin, Decadron taper, Diflucan, Mycelex troches sent to pharmacy.  Patient understands and agrees with the discharge plan.       Consultants: ENT, GI Procedures performed: Flexible fiberoptic laryngoscopy Disposition: Home Diet recommendation:  Discharge Diet Orders (From admission, onward)     Start     Ordered   01/20/23 0000  Diet - low sodium heart healthy       Comments: Bland soft diet   01/20/23 1043   01/20/23 0000  Diet Carb Modified       Comments: Bland soft diet   01/20/23 1043           Cardiac and Carb modified diet DISCHARGE MEDICATION: Allergies as of 01/20/2023       Reactions   Codeine Nausea And Vomiting   Tramadol Nausea And Vomiting        Medication List     STOP taking these medications    azithromycin 250 MG tablet Commonly known as: ZITHROMAX   Corlanor 5 MG Tabs tablet Generic drug: ivabradine   sucralfate 1 g tablet Commonly known as: CARAFATE       TAKE these medications    amoxicillin-clavulanate 600-42.9 MG/5ML  suspension Commonly known as: Augmentin ES-600 Take 5 mLs (600 mg total) by mouth 2 (two) times daily.   apixaban 5 MG Tabs tablet Commonly known as: ELIQUIS Take 1 tablet (5 mg total) by mouth 2 (two) times daily.   atorvastatin 40 MG tablet Commonly known as: LIPITOR Take 40 mg by mouth daily.   Cholecalciferol 25 MCG (1000 UT) tablet Take 1,000 Units by mouth daily.    clotrimazole 10 MG troche Commonly known as: MYCELEX Take 1 tablet (10 mg total) by mouth 3 (three) times daily.   dexamethasone 2 MG tablet Commonly known as: DECADRON Take 3 tablets (6 mg total) by mouth 2 (two) times daily for 2 days, THEN 2 tablets (4 mg total) 2 (two) times daily for 2 days, THEN 1 tablet (2 mg total) 2 (two) times daily for 2 days, THEN 1 tablet (2 mg total) daily for 2 days. Start taking on: January 20, 2023   famotidine 20 MG tablet Commonly known as: PEPCID Take 20 mg by mouth 2 (two) times daily.   fluconazole 100 MG tablet Commonly known as: Diflucan Take 1 tablet (100 mg total) by mouth daily for 14 days.   fluticasone 50 MCG/ACT nasal spray Commonly known as: FLONASE Place 1 spray into the nose in the morning and at bedtime.   gabapentin 100 MG capsule Commonly known as: NEURONTIN Take 100 mg by mouth 3 (three) times daily.   losartan 25 MG tablet Commonly known as: COZAAR Take 25 mg by mouth daily.   metoprolol tartrate 25 MG tablet Commonly known as: LOPRESSOR Take 12.5 mg by mouth daily.   pantoprazole 40 MG tablet Commonly known as: PROTONIX Take 40 mg by mouth daily.   phenol 1.4 % Liqd Commonly known as: CHLORASEPTIC Use as directed 1 spray in the mouth or throat as needed for throat irritation / pain.   polyethylene glycol 17 g packet Commonly known as: MIRALAX / GLYCOLAX Take 17 g by mouth 2 (two) times daily. Mix in 4-8oz fluid   psyllium 95 % Pack Commonly known as: HYDROCIL/METAMUCIL Take 1 packet by mouth daily.   torsemide 10 MG tablet Commonly known as: DEMADEX Take 10 mg by mouth 2 (two) times daily.        Discharge Exam: Filed Weights   01/18/23 0737 01/19/23 0300  Weight: 95.3 kg 89.9 kg   General - Elderly African American female, no apparent distress HEENT - PERRLA, EOMI, atraumatic head, non tender sinuses. Lung - Clear, rales, rhonchi, wheezes. Heart - S1, S2 heard, no murmurs, rubs, trace pedal  edema Neuro - Alert, awake and oriented x 3, non focal exam. Skin - Warm and dry.  Condition at discharge: stable  The results of significant diagnostics from this hospitalization (including imaging, microbiology, ancillary and laboratory) are listed below for reference.   Imaging Studies: CT CHEST W CONTRAST  Result Date: 01/18/2023 CLINICAL DATA:  82 year old female with productive cough, started on antibiotics for sinus infection. Throat pain, painful swallowing. EXAM: CT CHEST WITH CONTRAST TECHNIQUE: Multidetector CT imaging of the chest was performed during intravenous contrast administration. RADIATION DOSE REDUCTION: This exam was performed according to the departmental dose-optimization program which includes automated exposure control, adjustment of the mA and/or kV according to patient size and/or use of iterative reconstruction technique. CONTRAST:  75mL OMNIPAQUE IOHEXOL 300 MG/ML SOLN in conjunction with contrast enhanced imaging of the neck reported separately. COMPARISON:  Neck CT today reported separately. Chest radiographs yesterday. Prior chest CT 03/10/2016. FINDINGS: Cardiovascular:  Calcified coronary artery and Calcified aortic atherosclerosis. Cardiac size is at the upper limits of normal to mildly enlarged. No pericardial effusion. Unremarkable other central mediastinal vascular structures. Mediastinum/Nodes: Generalized, circumferential esophageal wall thickening up to 8 mm (series 3, image 38) beginning at the thoracic inlet and continuing distally. Tapering of the esophageal wall at either a small gastric hiatal hernia or chronic phrenic ampulla. No significant mediastinal inflammatory stranding. No mediastinal gas, fluid, or lymphadenopathy. Lungs/Pleura: Mildly lower lung volumes compared to 2017. Major airways remain patent. Mediastinal lipomatosis affecting both middle lobes. Mild right middle lobe atelectasis. Linear platelike atelectasis and/or scarring in the lower lobes  is not significantly changed compared to 2017. No consolidation or pleural effusion. A small 3 mm left lung nodule on series 4, image 59 is stable since 2017 and benign (no follow-up imaging recommended). Upper Abdomen: Chronic cholecystectomy and hepatic steatosis. Visible liver, spleen, pancreas, adrenal glands, and left kidney appears stable since 2017. Decompressed stomach. Diverticulosis of the transverse colon. No upper abdominal free air or free fluid. Musculoskeletal: Chronically exaggerated upper thoracic kyphosis. No acute osseous abnormality identified. IMPRESSION: 1. Acute Esophagitis; abnormal circumferential wall thickening throughout the thoracic esophagus. Consider Candida Esophagitis in this age group, or might be related to suspected URI (Neck CT reported separately). 2. Negative for pneumonia, and no other acute finding in the Chest. 3.  Aortic Atherosclerosis (ICD10-I70.0).  Hepatic steatosis. Electronically Signed   By: Odessa Fleming M.D.   On: 01/18/2023 04:27   CT Soft Tissue Neck W Contrast  Result Date: 01/18/2023 CLINICAL DATA:  82 year old female with productive cough, started on antibiotics for sinus infection. Throat pain, painful swallowing. EXAM: CT NECK WITH CONTRAST TECHNIQUE: Multidetector CT imaging of the neck was performed using the standard protocol following the bolus administration of intravenous contrast. RADIATION DOSE REDUCTION: This exam was performed according to the departmental dose-optimization program which includes automated exposure control, adjustment of the mA and/or kV according to patient size and/or use of iterative reconstruction technique. CONTRAST:  75mL OMNIPAQUE IOHEXOL 300 MG/ML  SOLN COMPARISON:  Chest CT today reported separately. Chest radiographs yesterday. FINDINGS: Pharynx and larynx: Pharynx motion artifact including at the epiglottis. At the glottis level laryngeal contours are normal. Epiglottis probably remains normal on series 3, image 47. The  soft palate appears edematous including the uvula (sagittal image 39). And there is mild generalized lingual tonsil, palatine tonsil and adenoid hypertrophy. No tonsillar hyperenhancement or separation identified. Parapharyngeal and retropharyngeal spaces remain normal. Salivary glands: Negative when allowing for mild motion artifact. Thyroid: Negative. Lymph nodes: Nonenlarged bilateral level 2 lymph nodes may be mildly hyperenhancing (such as on the right series 8, image 38). But there are no abnormally enlarged, cystic or necrotic lymph nodes. Vascular: Major vascular structures in the neck and at the skull base are patent. Left carotid bifurcation calcified atherosclerosis. Limited intracranial: Negative; Calcified atherosclerosis at the skull base. Visualized orbits: Minimally included. Mastoids and visualized paranasal sinuses: Visualized paranasal sinuses and mastoids are clear. Tympanic cavities appear clear. Skeleton: Mandible motion artifact. Age-appropriate cervical spine degeneration. No acute osseous abnormality identified. Upper chest: Dedicated chest CT is reported separately today. IMPRESSION: 1. Mild motion artifact. 2. Soft palate and uvula appear edematous, along with generalized mild pharyngeal tonsillar hypertrophy. Favor URI with combined Pharyngitis/Tonsillitis. Angioedema felt less likely. No abscess or other complicating features. 3. Chest CT reported separately. Electronically Signed   By: Odessa Fleming M.D.   On: 01/18/2023 04:21   DG Chest 2 View  Result  Date: 01/17/2023 CLINICAL DATA:  Productive cough, difficulty swallowing EXAM: CHEST - 2 VIEW COMPARISON:  None Available. FINDINGS: The heart size and mediastinal contours are within normal limits. Both lungs are clear. The visualized skeletal structures are unremarkable. IMPRESSION: No active cardiopulmonary disease. Electronically Signed   By: Sharlet Salina M.D.   On: 01/17/2023 22:54    Microbiology: Results for orders placed or  performed during the hospital encounter of 01/18/23  Culture, blood (routine x 2)     Status: None (Preliminary result)   Collection Time: 01/17/23 10:02 PM   Specimen: BLOOD RIGHT ARM  Result Value Ref Range Status   Specimen Description BLOOD RIGHT ARM  Final   Special Requests   Final    BOTTLES DRAWN AEROBIC AND ANAEROBIC Blood Culture adequate volume   Culture   Final    NO GROWTH 2 DAYS Performed at [facility], [address] [place], [place] [address]    Report Status PENDING  Incomplete  Culture, blood (routine x 2)     Status: None (Preliminary result)   Collection Time: 01/18/23  6:37 AM   Specimen: BLOOD RIGHT HAND  Result Value Ref Range Status   Specimen Description BLOOD RIGHT HAND  Final   Special Requests   Final    BOTTLES DRAWN AEROBIC AND ANAEROBIC Blood Culture results may not be optimal due to an excessive volume of blood received in culture bottles   Culture   Final    NO GROWTH 2 DAYS Performed at [facility], [address] [place], [place] [address]    Report Status PENDING  Incomplete    Labs: CBC: Recent Labs  Lab 01/17/23 2202 01/19/23 0615 01/20/23 0558  WBC 17.5* 16.3* 15.2*  NEUTROABS 13.7*  --   --   HGB 11.9* 10.0* 9.8*  HCT 37.9 31.2* 31.6*  MCV 98.7 96.6 98.1  PLT 270 244 240   Basic Metabolic Panel: Recent Labs  Lab 01/17/23 2202 01/19/23 0615  NA 139 142  K 4.0 4.0  CL 103 112*  CO2 26 23  GLUCOSE 120* 162*  BUN 15 13  CREATININE 1.13* 0.93  CALCIUM 9.6 8.9   Liver Function Tests: Recent Labs  Lab 01/17/23 2202  AST 22  ALT 32  ALKPHOS 72  BILITOT 1.4*  PROT 7.4  ALBUMIN 4.3   CBG: No results for input(s): "GLUCAP" in the last 168 hours.  Discharge time spent: greater than 30 minutes.  Signed: Marcelino Duster, MD [facility] 01/20/2023

## 2023-01-21 ENCOUNTER — Other Ambulatory Visit: Payer: Self-pay

## 2023-01-22 ENCOUNTER — Ambulatory Visit: Admission: RE | Admit: 2023-01-22 | Payer: Medicare Other | Source: Ambulatory Visit

## 2023-01-23 LAB — CULTURE, BLOOD (ROUTINE X 2)
Culture: NO GROWTH
Culture: NO GROWTH
Special Requests: ADEQUATE

## 2023-07-26 ENCOUNTER — Other Ambulatory Visit: Payer: Self-pay | Admitting: Family Medicine

## 2023-07-26 DIAGNOSIS — M48061 Spinal stenosis, lumbar region without neurogenic claudication: Secondary | ICD-10-CM

## 2023-07-26 DIAGNOSIS — M545 Low back pain, unspecified: Secondary | ICD-10-CM

## 2023-07-31 ENCOUNTER — Ambulatory Visit
Admission: RE | Admit: 2023-07-31 | Discharge: 2023-07-31 | Disposition: A | Discharge status: Home / Self Care | Payer: 59 | Source: Ambulatory Visit | Attending: Family Medicine | Admitting: Family Medicine

## 2023-07-31 DIAGNOSIS — M48061 Spinal stenosis, lumbar region without neurogenic claudication: Secondary | ICD-10-CM | POA: Diagnosis present

## 2023-07-31 DIAGNOSIS — M545 Low back pain, unspecified: Secondary | ICD-10-CM | POA: Diagnosis present

## 2023-08-27 ENCOUNTER — Emergency Department: Payer: 59

## 2023-08-27 ENCOUNTER — Emergency Department
Admission: EM | Admit: 2023-08-27 | Discharge: 2023-08-27 | Disposition: A | Discharge status: Home / Self Care | Payer: 59 | Attending: Emergency Medicine | Admitting: Emergency Medicine

## 2023-08-27 ENCOUNTER — Other Ambulatory Visit: Payer: Self-pay

## 2023-08-27 DIAGNOSIS — Z7901 Long term (current) use of anticoagulants: Secondary | ICD-10-CM | POA: Insufficient documentation

## 2023-08-27 DIAGNOSIS — K625 Hemorrhage of anus and rectum: Secondary | ICD-10-CM

## 2023-08-27 DIAGNOSIS — N183 Chronic kidney disease, stage 3 unspecified: Secondary | ICD-10-CM | POA: Diagnosis not present

## 2023-08-27 DIAGNOSIS — K644 Residual hemorrhoidal skin tags: Secondary | ICD-10-CM | POA: Insufficient documentation

## 2023-08-27 DIAGNOSIS — I130 Hypertensive heart and chronic kidney disease with heart failure and stage 1 through stage 4 chronic kidney disease, or unspecified chronic kidney disease: Secondary | ICD-10-CM | POA: Diagnosis not present

## 2023-08-27 DIAGNOSIS — I5030 Unspecified diastolic (congestive) heart failure: Secondary | ICD-10-CM | POA: Insufficient documentation

## 2023-08-27 LAB — COMPREHENSIVE METABOLIC PANEL
ALT: 37 U/L (ref 0–44)
AST: 31 U/L (ref 15–41)
Albumin: 4 g/dL (ref 3.5–5.0)
Alkaline Phosphatase: 63 U/L (ref 38–126)
Anion gap: 11 (ref 5–15)
BUN: 17 mg/dL (ref 8–23)
CO2: 28 mmol/L (ref 22–32)
Calcium: 10.4 mg/dL — ABNORMAL HIGH (ref 8.9–10.3)
Chloride: 103 mmol/L (ref 98–111)
Creatinine, Ser: 1.08 mg/dL — ABNORMAL HIGH (ref 0.44–1.00)
GFR, Estimated: 51 mL/min — ABNORMAL LOW (ref >60)
Glucose, Bld: 97 mg/dL (ref 70–99)
Potassium: 3.8 mmol/L (ref 3.5–5.1)
Sodium: 142 mmol/L (ref 135–145)
Total Bilirubin: 0.6 mg/dL (ref 0.0–1.2)
Total Protein: 7.2 g/dL (ref 6.5–8.1)

## 2023-08-27 LAB — CBC
HCT: 35.7 % — ABNORMAL LOW (ref 36.0–46.0)
Hemoglobin: 11.6 g/dL — ABNORMAL LOW (ref 12.0–15.0)
MCH: 30.2 pg (ref 26.0–34.0)
MCHC: 32.5 g/dL (ref 30.0–36.0)
MCV: 93 fL (ref 80.0–100.0)
Platelets: 230 10*3/uL (ref 150–400)
RBC: 3.84 MIL/uL — ABNORMAL LOW (ref 3.87–5.11)
RDW: 13.2 % (ref 11.5–15.5)
WBC: 5.4 10*3/uL (ref 4.0–10.5)
nRBC: 0 % (ref 0.0–0.2)

## 2023-08-27 LAB — TYPE AND SCREEN
ABO/RH(D): O POS
Antibody Screen: NEGATIVE

## 2023-08-27 MED ORDER — IOHEXOL 300 MG/ML  SOLN
100.0000 mL | Freq: Once | INTRAMUSCULAR | Status: AC | PRN
  Administered 2023-08-27: 100 mL via INTRAVENOUS

## 2023-08-27 NOTE — Discharge Instructions (Addendum)
Please call your GI team this week for follow-up and to notify them of the rectal bleeding episode you happen to have on Saturday.  Also, please call your primary care provider to set up an outpatient MRI for further evaluation of the spots seen on CT scan today on your pancreas and your liver.  These are most likely benign, but need MRI to further evaluate.  Please return for any worsening symptoms.

## 2023-08-27 NOTE — ED Provider Notes (Signed)
[facility] Provider Note    Event Date/Time   First MD Initiated Contact with Patient 08/27/23 [phone]     (approximate)   History   No chief complaint on file.   HPI Jodi Stein is a 83 y.o. female with history of GERD, HTN, HLD, HFpEF, CKD stage III, A-fib on Eliquis presenting today for rectal bleeding.  Patient states she has been seeing her GI team for constipation issues with most recently seen 2 weeks ago.  She was started on a regimen is now been having frequent bowel movements.  She reports on Saturday she had 3 episodes of brown stool with bright red blood from her rectum.  No melanotic stools.  Does have pain with bowel movement.  Denies any repeat bloody bowel movements in the past 2 days.  Feels abdominal distention with mild upper abdominal pain.  No nausea or vomiting.  No prior history of GI bleed.  Patient reports having colonoscopy last year which was largely reassuring.  Reviewed outpatient GI notes on 08/09/2023.     Physical Exam   Triage Vital Signs: ED Triage Vitals  Encounter Vitals Group     BP 08/27/23 0815 104/64     Systolic BP Percentile --      Diastolic BP Percentile --      Pulse Rate 08/27/23 0815 95     Resp 08/27/23 0815 16     Temp 08/27/23 0815 98 F (36.7 C)     Temp Source 08/27/23 0815 Oral     SpO2 08/27/23 0815 97 %     Weight 08/27/23 0816 188 lb 15 oz (85.7 kg)     Height 08/27/23 0816 5\' 2"  (1.575 m)     Head Circumference --      Peak Flow --      Pain Score 08/27/23 0816 0     Pain Loc --      Pain Education --      Exclude from Growth Chart --     Most recent vital signs: Vitals:   08/27/23 0815 08/27/23 0836  BP: 104/64 128/78  Pulse: 95 85  Resp: 16 18  Temp: 98 F (36.7 C) 98.5 F (36.9 C)  SpO2: 97% 100%   Physical Exam: I have reviewed the vital signs and nursing notes. General: Awake, alert, no acute distress.  Nontoxic appearing. Head:  Atraumatic, normocephalic.   ENT:  EOM  intact, PERRL. Oral mucosa is pink and moist with no lesions. Neck: Neck is supple with full range of motion, No meningeal signs. Cardiovascular:  RRR, No murmurs. Peripheral pulses palpable and equal bilaterally. Respiratory:  Symmetrical chest wall expansion.  No rhonchi, rales, or wheezes.  Good air movement throughout.  No use of accessory muscles.   Musculoskeletal:  No cyanosis or edema. Moving extremities with full ROM Abdomen:  Soft, mild distention, mild tenderness palpation throughout upper abdomen Rectal: Evidence of 1 small external hemorrhoid.  No anal fissure present. Neuro:  GCS 15, moving all four extremities, interacting appropriately. Speech clear. Psych:  Calm, appropriate.   Skin:  Warm, dry, no rash.    ED Results / Procedures / Treatments   Labs (all labs ordered are listed, but only abnormal results are displayed) Labs Reviewed  COMPREHENSIVE METABOLIC PANEL - Abnormal; Notable for the following components:      Result Value   Creatinine, Ser 1.08 (*)    Calcium 10.4 (*)    GFR, Estimated 51 (*)  All other components within normal limits  CBC - Abnormal; Notable for the following components:   RBC 3.84 (*)    Hemoglobin 11.6 (*)    HCT 35.7 (*)    All other components within normal limits  POC OCCULT BLOOD, ED  TYPE AND SCREEN     EKG    RADIOLOGY Independently interpreted CT abdomen/pelvis with no acute findings.   PROCEDURES:  Critical Care performed: No  Procedures   MEDICATIONS ORDERED IN ED: Medications  iohexol (OMNIPAQUE) 300 MG/ML solution 100 mL (100 mLs Intravenous Contrast Given 08/27/23 1024)     IMPRESSION / MDM / ASSESSMENT AND PLAN / ED COURSE  I reviewed the triage vital signs and the nursing notes.                              Differential diagnosis includes, but is not limited to, external hemorrhoid, internal hemorrhoid, anal fissure, enteritis, colitis, lower GI bleed such as diverticular, low suspicion for upper GI  bleed  Patient's presentation is most consistent with acute complicated illness / injury requiring diagnostic workup.  Patient is an 83 year old female presenting today for 1 day of rectal bleeding with no recurrent episodes of the past 2 days.  Vital signs are stable and physical exam notable for slight tenderness palpation in the upper abdominal regions with some abdominal distention.  Hemoglobin at 11.6 today is actually improved from her most recent baseline.  Rectal exam shows no melanotic stool present.  There is a small external hemorrhoid but no evidence of an anal fissure.  No active bleeding present at this time.  CT abdomen/pelvis with no acute findings.  There are several hypodense areas noted in the pancreas as well as the liver which radiology recommended outpatient MRI.  Patient has not had any more ongoing bleeding symptoms here today and hemoglobin is stable.  I suspect is likely more related to external hemorrhoid with all her recent diarrhea.  Told her to call her GI team for follow-up as well as her PCP for outpatient MRI of the CT findings.  Patient agreeable with this plan and given strict return precautions.  Clinical Course as of 08/27/23 1120  Mon Aug 27, 2023  1113 CT ABDOMEN PELVIS W CONTRAST No acute findings on CT abdomen/pelvis.  Noted chronic findings with outpatient MRI recommended.  Discussed this with patient who is aware. [DW]    Clinical Course User Index [DW] Janith Lima, MD     FINAL CLINICAL IMPRESSION(S) / ED DIAGNOSES   Final diagnoses:  Rectal bleeding  External hemorrhoid     Rx / DC Orders   ED Discharge Orders     None        Note:  This document was prepared using Dragon voice recognition software and may include unintentional dictation errors.   Janith Lima, MD 08/27/23 1120

## 2023-08-27 NOTE — ED Notes (Signed)
 Pt to CT

## 2023-08-27 NOTE — ED Triage Notes (Signed)
C/O rectal bleeding over the weekend. Last noticed Saturday afternoon. Denies current bleeding.  AAOx3.  Skin warm and dry. NAD

## 2023-08-27 NOTE — ED Notes (Signed)
Pt provided blanket and pillow for comfort by this RN. Call bell within reach. NAD.

## 2023-11-19 ENCOUNTER — Other Ambulatory Visit: Payer: Self-pay | Admitting: Internal Medicine

## 2023-11-19 DIAGNOSIS — Z1231 Encounter for screening mammogram for malignant neoplasm of breast: Secondary | ICD-10-CM

## 2023-12-13 ENCOUNTER — Encounter

## 2024-01-22 ENCOUNTER — Encounter

## 2024-01-31 ENCOUNTER — Ambulatory Visit
Admission: RE | Admit: 2024-01-31 | Discharge: 2024-01-31 | Disposition: A | Discharge status: Home / Self Care | Source: Ambulatory Visit | Attending: Internal Medicine | Admitting: Internal Medicine

## 2024-01-31 DIAGNOSIS — Z1231 Encounter for screening mammogram for malignant neoplasm of breast: Secondary | ICD-10-CM | POA: Diagnosis present
